# Patient Record
Sex: Female | Born: 1975
Health system: Southern US, Community
[De-identification: ages and names within clinical notes are randomized; demographics above are authoritative.]

## PROBLEM LIST (undated history)

## (undated) DIAGNOSIS — K219 Gastro-esophageal reflux disease without esophagitis: Secondary | ICD-10-CM

## (undated) DIAGNOSIS — L309 Dermatitis, unspecified: Secondary | ICD-10-CM

## (undated) DIAGNOSIS — J302 Other seasonal allergic rhinitis: Secondary | ICD-10-CM

## (undated) DIAGNOSIS — Z9289 Personal history of other medical treatment: Secondary | ICD-10-CM

## (undated) DIAGNOSIS — N939 Abnormal uterine and vaginal bleeding, unspecified: Secondary | ICD-10-CM

## (undated) HISTORY — DX: Gastro-esophageal reflux disease without esophagitis: K21.9

## (undated) HISTORY — PX: CERVICAL BIOPSY  W/ LOOP ELECTRODE EXCISION: SUR135

## (undated) HISTORY — PX: KNEE ARTHROSCOPY W/ ACL RECONSTRUCTION: SHX1858

---

## 1999-08-07 ENCOUNTER — Encounter: Payer: Self-pay | Admitting: Obstetrics and Gynecology

## 1999-08-07 ENCOUNTER — Ambulatory Visit (HOSPITAL_COMMUNITY): Admission: RE | Admit: 1999-08-07 | Discharge: 1999-08-07 | Payer: Self-pay | Admitting: Obstetrics and Gynecology

## 1999-09-04 ENCOUNTER — Ambulatory Visit (HOSPITAL_COMMUNITY): Admission: RE | Admit: 1999-09-04 | Discharge: 1999-09-04 | Payer: Self-pay | Admitting: Obstetrics & Gynecology

## 1999-09-04 ENCOUNTER — Encounter: Payer: Self-pay | Admitting: Obstetrics and Gynecology

## 2000-01-02 ENCOUNTER — Inpatient Hospital Stay (HOSPITAL_COMMUNITY): Admission: AD | Admit: 2000-01-02 | Discharge: 2000-01-02 | Payer: Self-pay | Admitting: Obstetrics and Gynecology

## 2000-01-03 ENCOUNTER — Inpatient Hospital Stay (HOSPITAL_COMMUNITY): Admission: AD | Admit: 2000-01-03 | Discharge: 2000-01-03 | Payer: Self-pay | Admitting: Obstetrics and Gynecology

## 2000-01-05 ENCOUNTER — Inpatient Hospital Stay (HOSPITAL_COMMUNITY): Admission: AD | Admit: 2000-01-05 | Discharge: 2000-01-05 | Payer: Self-pay | Admitting: Obstetrics and Gynecology

## 2000-01-08 ENCOUNTER — Inpatient Hospital Stay (HOSPITAL_COMMUNITY): Admission: AD | Admit: 2000-01-08 | Discharge: 2000-01-10 | Payer: Self-pay | Admitting: Obstetrics and Gynecology

## 2000-02-18 ENCOUNTER — Other Ambulatory Visit: Admission: RE | Admit: 2000-02-18 | Discharge: 2000-02-18 | Payer: Self-pay | Admitting: Obstetrics and Gynecology

## 2000-12-11 ENCOUNTER — Emergency Department (HOSPITAL_COMMUNITY): Admission: EM | Admit: 2000-12-11 | Discharge: 2000-12-11 | Payer: Self-pay | Admitting: Emergency Medicine

## 2001-02-04 ENCOUNTER — Other Ambulatory Visit: Admission: RE | Admit: 2001-02-04 | Discharge: 2001-02-04 | Payer: Self-pay | Admitting: Obstetrics and Gynecology

## 2002-04-06 ENCOUNTER — Other Ambulatory Visit: Admission: RE | Admit: 2002-04-06 | Discharge: 2002-04-06 | Payer: Self-pay | Admitting: Obstetrics and Gynecology

## 2003-01-05 ENCOUNTER — Other Ambulatory Visit: Admission: RE | Admit: 2003-01-05 | Discharge: 2003-01-05 | Payer: Self-pay | Admitting: Obstetrics and Gynecology

## 2003-01-26 ENCOUNTER — Ambulatory Visit (HOSPITAL_COMMUNITY): Admission: RE | Admit: 2003-01-26 | Discharge: 2003-01-26 | Payer: Self-pay | Admitting: Obstetrics and Gynecology

## 2003-01-26 HISTORY — PX: OTHER SURGICAL HISTORY: SHX169

## 2003-06-13 ENCOUNTER — Encounter: Payer: Self-pay | Admitting: Orthopedic Surgery

## 2003-06-14 ENCOUNTER — Ambulatory Visit (HOSPITAL_COMMUNITY): Admission: RE | Admit: 2003-06-14 | Discharge: 2003-06-14 | Payer: Self-pay | Admitting: Orthopedic Surgery

## 2003-06-14 HISTORY — PX: HIP ARTHROSCOPY W/ LABRAL DEBRIDEMENT: SHX1749

## 2003-06-29 ENCOUNTER — Other Ambulatory Visit: Admission: RE | Admit: 2003-06-29 | Discharge: 2003-06-29 | Payer: Self-pay | Admitting: Obstetrics and Gynecology

## 2004-07-26 ENCOUNTER — Other Ambulatory Visit: Admission: RE | Admit: 2004-07-26 | Discharge: 2004-07-26 | Payer: Self-pay | Admitting: Obstetrics and Gynecology

## 2005-06-25 ENCOUNTER — Encounter: Admission: RE | Admit: 2005-06-25 | Discharge: 2005-06-25 | Payer: Self-pay | Admitting: Neurology

## 2005-09-15 ENCOUNTER — Encounter: Admission: RE | Admit: 2005-09-15 | Discharge: 2005-09-15 | Payer: Self-pay | Admitting: Family Medicine

## 2005-09-22 ENCOUNTER — Encounter: Admission: RE | Admit: 2005-09-22 | Discharge: 2005-09-22 | Payer: Self-pay | Admitting: Family Medicine

## 2005-10-08 ENCOUNTER — Other Ambulatory Visit: Admission: RE | Admit: 2005-10-08 | Discharge: 2005-10-08 | Payer: Self-pay | Admitting: Obstetrics and Gynecology

## 2005-11-11 ENCOUNTER — Ambulatory Visit (HOSPITAL_COMMUNITY): Admission: RE | Admit: 2005-11-11 | Discharge: 2005-11-11 | Payer: Self-pay | Admitting: Obstetrics and Gynecology

## 2005-11-11 HISTORY — PX: LAPAROSCOPIC TUBAL LIGATION: SUR803

## 2006-01-12 ENCOUNTER — Encounter: Admission: RE | Admit: 2006-01-12 | Discharge: 2006-01-12 | Payer: Self-pay | Admitting: Family Medicine

## 2006-05-26 ENCOUNTER — Ambulatory Visit: Payer: Self-pay | Admitting: Internal Medicine

## 2006-06-02 ENCOUNTER — Ambulatory Visit: Payer: Self-pay | Admitting: Internal Medicine

## 2006-06-24 ENCOUNTER — Ambulatory Visit: Payer: Self-pay | Admitting: Family Medicine

## 2006-07-15 ENCOUNTER — Ambulatory Visit: Payer: Self-pay | Admitting: Family Medicine

## 2006-07-21 ENCOUNTER — Ambulatory Visit: Payer: Self-pay | Admitting: Family Medicine

## 2006-11-04 ENCOUNTER — Ambulatory Visit: Payer: Self-pay | Admitting: Internal Medicine

## 2006-12-02 ENCOUNTER — Ambulatory Visit: Payer: Self-pay | Admitting: Family Medicine

## 2007-01-04 ENCOUNTER — Ambulatory Visit: Payer: Self-pay | Admitting: Family Medicine

## 2007-01-11 DIAGNOSIS — J45909 Unspecified asthma, uncomplicated: Secondary | ICD-10-CM | POA: Insufficient documentation

## 2007-04-06 ENCOUNTER — Ambulatory Visit: Payer: Self-pay | Admitting: Family Medicine

## 2007-08-23 ENCOUNTER — Ambulatory Visit: Payer: Self-pay | Admitting: Family Medicine

## 2007-08-23 DIAGNOSIS — M25569 Pain in unspecified knee: Secondary | ICD-10-CM | POA: Insufficient documentation

## 2007-08-24 ENCOUNTER — Telehealth (INDEPENDENT_AMBULATORY_CARE_PROVIDER_SITE_OTHER): Payer: Self-pay | Admitting: *Deleted

## 2007-12-29 ENCOUNTER — Ambulatory Visit: Payer: Self-pay | Admitting: Internal Medicine

## 2007-12-29 DIAGNOSIS — N921 Excessive and frequent menstruation with irregular cycle: Secondary | ICD-10-CM | POA: Insufficient documentation

## 2008-01-02 LAB — CONVERTED CEMR LAB
ALT: 10 units/L (ref 0–35)
Albumin: 4.5 g/dL (ref 3.5–5.2)
BUN: 9 mg/dL (ref 6–23)
Bilirubin, Direct: 0.1 mg/dL (ref 0.0–0.3)
Eosinophils Relative: 2.6 % (ref 0.0–5.0)
GFR calc Af Amer: 107 mL/min
GFR calc non Af Amer: 88 mL/min
Hemoglobin: 14.5 g/dL (ref 12.0–15.0)
MCHC: 33.1 g/dL (ref 30.0–36.0)
Neutro Abs: 1.8 10*3/uL (ref 1.4–7.7)
Potassium: 4.3 meq/L (ref 3.5–5.1)
RBC: 4.4 M/uL (ref 3.87–5.11)
RDW: 11.6 % (ref 11.5–14.6)
Sodium: 139 meq/L (ref 135–145)
WBC: 5.5 10*3/uL (ref 4.5–10.5)

## 2008-01-03 ENCOUNTER — Encounter (INDEPENDENT_AMBULATORY_CARE_PROVIDER_SITE_OTHER): Payer: Self-pay | Admitting: *Deleted

## 2008-01-11 ENCOUNTER — Encounter (INDEPENDENT_AMBULATORY_CARE_PROVIDER_SITE_OTHER): Payer: Self-pay | Admitting: *Deleted

## 2008-01-11 ENCOUNTER — Ambulatory Visit: Payer: Self-pay | Admitting: Family Medicine

## 2008-01-11 LAB — CONVERTED CEMR LAB
OCCULT 2: NEGATIVE
OCCULT 3: NEGATIVE

## 2009-07-04 ENCOUNTER — Ambulatory Visit: Payer: Self-pay | Admitting: Internal Medicine

## 2009-07-04 ENCOUNTER — Encounter (INDEPENDENT_AMBULATORY_CARE_PROVIDER_SITE_OTHER): Payer: Self-pay | Admitting: *Deleted

## 2009-07-04 DIAGNOSIS — H6980 Other specified disorders of Eustachian tube, unspecified ear: Secondary | ICD-10-CM | POA: Insufficient documentation

## 2011-03-14 NOTE — Assessment & Plan Note (Signed)
[facility]                        [facility] OFFICE NOTE   NAME:[name], [name]                        MRN:          [id number]  DATE:12/02/2006                            DOB:          09-06-1976    REASON FOR VISIT:  Discuss smoking cessation.   [name] is a 35 year old female who reports that she is ready to stop  smoking. She has attempted several times in the past, but unfortunately  has not been successful. She has heard about the medication Chantix and  wanted to discuss starting the treatment with the above. She has no  other concerns.   PAST MEDICAL HISTORY:  Asthma.   PAST SURGICAL HISTORY:  1. Anterior cruciate ligament repair of the left knee in 1992.  2. LEEP procedure in 2003.  3. Right fourth digit hammertoe surgery in 2004.  4. Laparoscopy in 2004, IE, pelvic.  5. Hip surgery in 2004.  6. Miscarriage.  7. Tubal ligation.   CURRENT MEDICATIONS:  Albuterol p.r.n.   ALLERGIES:  SULFA.   OBJECTIVE:  Weight is 160, pulse is 68, blood pressure 108/78.  In general, pleasant female in no acute distress. Rest of the  examination was deferred.   IMPRESSION:  Smoking cessation counseling.   PLAN:  1. Advised the patient to let family and friends know of her quit      date. Also, recommended she call 1-800-QUITNOW.  2. Agreed to start on Chantix starter pack. Side effects and      instructions were provided.  3. The patient to followup with me in 1 month or sooner if needed.     Leanne Chang, M.D.  Electronically Signed    LA/MedQ  DD: 12/02/2006  DT: 12/02/2006  Job #: [id number]

## 2011-03-14 NOTE — Op Note (Signed)
[name], [name]                 ACCOUNT NO.:  [id number]   MEDICAL RECORD NO.:  [id number]          PATIENT TYPE:  AMB   LOCATION:  SDC                           FACILITY:  [facility]   PHYSICIAN:  Zenaida Niece, M.D.DATE OF BIRTH:  February 17, 1976   DATE OF PROCEDURE:  11/11/2005  DATE OF DISCHARGE:                                 OPERATIVE REPORT   PREOPERATIVE DIAGNOSIS:  Desires surgical sterility.   POSTOPERATIVE DIAGNOSIS:  Desires surgical sterility.   PROCEDURE:  Laparoscopy with bilateral tubal fulguration.   SURGEON:  Zenaida Niece, M.D.   ANESTHESIA:  General endotracheal tube.   SPECIMENS:  None.   ESTIMATED BLOOD LOSS:  Minimal.   COMPLICATIONS:  None.   FINDINGS:  Normal pelvic anatomy.   PROCEDURE IN DETAIL:  The patient was taken to the operating room and placed  in the dorsal supine position.  General anesthesia was induced and she was  placed in mobile stirrups.  Abdomen was prepped and draped in the usual  sterile fashion, bladder drained with a red rubber catheter, Hulka tenaculum  applied to the cervix for uterine manipulation.  Infraumbilical skin was  then infiltrated with 0.25% Marcaine and a 1.5 cm horizontal incision was  made in her previous scar.  The Veress needle was inserted into the  peritoneal cavity and placement confirmed by the water drop test and an  opening pressure of 3 mmHg.  CO2 gas was insufflated to a pressure of 14  mmHg and the Veress needle was removed.  The size 11 disposable trocar was  introduced and placement confirmed by the laparoscope.  Inspection revealed  normal pelvic anatomy.  Both fallopian tubes were identified and traced to  their fimbriated ends.  Three segments of the middle portion of each tube  were fulgurated with bipolar cautery under direct visualization until the  amp meter read 0 in all spots.  Again this was done on both sides with good  results.  Ovaries looked normal.  Again the remainder of the pelvis  remained  normal.  The laparoscope was then removed and all gas allowed to deflate  from the abdomen.  The umbilical trocar was removed.  Skin was closed with  interrupted subcuticular sutures of 4-0 Vicryl, followed by Dermabond.  The  Hulka tenaculum was then removed.  The patient tolerated the procedure well.  She was extubated in the operating room and taken to the recovery room in  stable condition.      Zenaida Niece, M.D.  Electronically Signed     TDM/MEDQ  D:  11/11/2005  T:  11/11/2005  Job:  [id number]
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
## 2011-03-14 NOTE — Assessment & Plan Note (Signed)
[facility]                          [facility] OFFICE NOTE   NAME:[name], [name]                        MRN:          [id number]  DATE:05/26/2006                            DOB:          09/02/1976    CHIEF COMPLAINT:  Ankle pain.   HISTORY OF PRESENT ILLNESS:  Ms. Nordgren is a 35 year old white female who is  a patient of Dr. Blossom Hoops, who came today due to right ankle pain.  The  pain started last night after she stood up from being seated in the [place]  position.  The pain is located at the right ankle, medial aspect.  She  denies any injury, swelling.  She has been taking some Motrin.   PAST MEDICAL HISTORY:  1.  History of mild asthma.  2.  In 1992, she had a left knee ACL repair.  3.  In 2003, had a LEEP procedure due to abnormal cervical cells.  4.  Status post surgery for fourth digit hammertoe on the right side.  Also,      on the fifth digit of the same foot in 2005.  5.  In 2004, she had a pelvic laparoscopy due to dyspareunia, according to      the patient.  6.  She had hip surgery in 2004, and apparently she was told she had some      loose cartilage.  7.  History of one miscarriage.  8.  History of tubal sterilization with Dr. Jackelyn Knife.   FAMILY HISTORY:  1.  Heart disease on the grandparents.  2.  Hypertension on the grandfather.  3.  Grandmother has diabetes.  4.  Great-grandparents had strokes and different cancers.  5.  Great uncle has muscular dystrophy.   REVIEW OF SYSTEMS:  Other than the pain in the ankle, she is doing well.   SOCIAL HISTORY:  She smokes cigarettes, about two-thirds of a pack a day.  Denies any alcohol drinking except maybe a glass of wine every six months.  She is married, and they have one child.   MEDICATIONS:  Not on a routine basis.   ALLERGIES:  She is allergic to SULFA.   PHYSICAL EXAMINATION:  VITAL SIGNS:  Blood pressure 120/80, pulse 86,  respirations 12.  GENERAL:  Patient is  alert and oriented in no apparent distress.  She weighs  156 pounds.  EXTREMITIES:  Lower extremities:  Left ankle is normal.  Left foot is  normal.  Right ankle is slightly tender at the medial aspect without any  swelling, redness, or warmth.  There is a slight discoloration around the  malleolus.  The range of motion is normal.  Pedal pulses also normal.   ASSESSMENT/PLAN:  Ms. Basham has most likely sprained the ankle.  At this  point, I recommend ice every night, elevate the leg as much as she can.  Use  Motrin p.r.n.  We are also going to give her an ankle support/splint to  replace the old one she has from a previous injury.  If that fails to help  or if she does not improve  in the next few days, she is to let us know.                                   Willow Ora, MD   JP/MedQ  DD:  05/26/2006  DT:  05/26/2006  Job #:  [id number]

## 2011-03-14 NOTE — Op Note (Signed)
NAME:  [name], [name]                           ACCOUNT NO.:  [id number]   MEDICAL RECORD NO.:  [id number]                   PATIENT TYPE:  AMB   LOCATION:  DAY                                  FACILITY:  [facility]   PHYSICIAN:  Zenaida Niece, M.D.             DATE OF BIRTH:  31-Aug-1976   DATE OF PROCEDURE:  01/26/2003  DATE OF DISCHARGE:                                 OPERATIVE REPORT   PREOPERATIVE DIAGNOSIS:  Dyspareunia.   POSTOPERATIVE DIAGNOSIS:  Dyspareunia.   PROCEDURE:  Diagnostic laparoscopy.   SURGEON:  Zenaida Niece, M.D.   ANESTHESIA:  General endotracheal tube.   ESTIMATED BLOOD LOSS:  Less than 50 mL.   FINDINGS:  Normal abdominal and pelvic anatomy with a simple 2 cm left  ovarian cyst.   PROCEDURE IN DETAIL:  The patient was taken to the operating room and placed  in the dorsal supine position.  General anesthesia was induced, and she was  placed in mobile stirrups.  Abdomen was prepped and draped in the usual  sterile fashion, bladder drained with a red rubber catheter, and a Hulka  tenaculum applied to her cervix for uterine manipulation.  Her  infraumbilical skin was then infiltrated with 0.25% Marcaine, and a 1.5 cm  horizontal incision was made.  I first attempted to enter the abdomen just  with the 10-11 disposable trocar.  However, I was unable to enter the  peritoneal cavity completely.  The Veress needle was then inserted into the  peritoneal cavity and the abdomen insufflated with CO2 gas to a pressure of  10 mmHg.  The Veress needle was removed, and the disposable trocar was  introduced without difficulty.  Placement was then confirmed by the  laparoscope.  A 5 mm port was placed low in the midline under direct  visualization.  Inspection revealed a normal appendix and normal bowels,  normal liver edge.  Uterus was normal and retroverted.  Both tubes and  ovaries were normal.  Anterior and posterior cul-de-sacs and both ovarian  fossae were  also normal.  There was no evidence of endometriosis or  adhesions.  No source for her dyspareunia was identified.  All sites were  hemostatic.  The 5 mm port was removed under direct visualization.  The  laparoscope was removed and all gas allowed to deflate from the abdomen.  The 10-11 disposable trocar was then removed.  The infraumbilical fascia was  reapproximated with a figure-of-eight suture of 0 Vicryl.  Skin incisions  were closed with interrupted subcuticular sutures of 4-0 Vicryl followed by  Steri-Strips and band-aids.  The Hulka tenaculum was then removed.  The  patient tolerated the procedure well, was extubated in the operating room,  and taken to the recovery room in stable condition.  Counts were correct,  and she was not given any antibiotics.  Zenaida Niece, M.D.    TDM/MEDQ  D:  01/26/2003  T:  01/26/2003  Job:  [id number]

## 2011-03-14 NOTE — Op Note (Signed)
NAME:  [name], [name]                           ACCOUNT NO.:  [id number]   MEDICAL RECORD NO.:  [id number]                   PATIENT TYPE:  OIB   LOCATION:  2899                                 FACILITY:  [facility]   PHYSICIAN:  Ollen Gross, M.D.                 DATE OF BIRTH:  05-25-1976   DATE OF PROCEDURE:  06/14/2003  DATE OF DISCHARGE:  06/14/2003                                 OPERATIVE REPORT   PREOPERATIVE DIAGNOSIS:  Left hip labral tear.   POSTOPERATIVE DIAGNOSIS:  Left hip labral tear plus chondral defect,  acetabulum.   OPERATION PERFORMED:  Left hip arthroscopy with labral debridement and  chondroplasty.   SURGEON:  Ollen Gross, M.D.   ASSISTANT:  Alexzandrew L. Julien Girt, P.A.   ANESTHESIA:  General.   ESTIMATED BLOOD LOSS:  Minimal.   DRAINS:  None.   COMPLICATIONS:  None.   CONDITION:  Stable to recovery.   INDICATIONS FOR PROCEDURE:  [name] is a 35 year old female with an  approximately two to three year history of activity related left hip and  groin pain which has gotten progressively worse over the past year or so.  Exam and history were suggestive of labral tear.  She has had intractable  pain and presents now for diagnostic arthroscopy with possible labral  debridement and chondroplasty.   DESCRIPTION OF PROCEDURE:  After successful administration of general  anesthetic, the patient was placed in the right lateral decubitus position  with the left side up and her left foot was well padded and placed in a  traction boot and the left leg was draped over the well-padded perineal  post.  Traction was applied across the hip under fluoroscopic guidance and  when adequately distracted, the leg was locked in that position and hip  prepped and draped in the usual sterile fashion.  The standard anterior and  posterior peritrochanteric portals were localized and spinal needles were  passed through the skin down into the joint.  They were confirmed to be  intra-articular by fluoroscopy and also by injecting saline through the  anterior needle and had outflow through the posterior needle.  Injected  about 30 mL saline through the joint, placed a nitinol wire through the  posterior needle, removed the needle and made incision around the wire.  We  dilated up to 5 mm and placed a 5 mm cannula posteriorly.  The camera was  then passed into the joint and arthroscopic visualization proceeded.   The chondral surface of the femoral head was completely normal.  Posterior,  superior and central acetabulum was normal.  Anteriorly, there was evidence  of anterior inferior, anterior central labral tear and there appeared to be  some chondral blistering adjacent to the tear.  We then passed a wire  through the anterior needle, made the incision, dilated it, passed a 5 mm  cannula.  I passed a shaver  into the joint and indeed, the blistering did  represent a chondral delamination of about 1 x 1 cm of the anterior inferior  and anterior central acetabulum.  We debrided the labral tear and chondral  defect back to a stable base using the 4.2 shaver as well as the ArthroCare  device. We used the ArthroCare on the labrum and the shaver on both the  labrum and the chondral defect.  The chondral defect was then probed and  found to be stable.  The overall area of this was about 1 x 1 cm at its  largest.  The remainder of the joint was inspected and looked normal.  There  was some anterior synovitis present and I used the ArthroCare to debride  that, so that would not serve as a potential source of catching in the  joint.  Once this was completed, then the arthroscopic equipment was removed  from the anterior portal and 20mL of 0.5% Marcaine with epinephrine injected  through the inflow cannula and the cannula was subsequently removed.  I  released the traction and confirmed that the hip was concentrically reduced  with a fluoroscopic view.  I then closed the  incisions with interrupted 4-0  nylon and placed a bulky sterile dressing.  She was taken out of the  traction boot, awakened and transported to recovery in stable condition.                                                Ollen Gross, M.D.    FA/MEDQ  D:  06/14/2003  T:  06/15/2003  Job:  [id number]

## 2015-10-28 HISTORY — PX: KNEE ARTHROSCOPY W/ MEDIAL COLLATERAL LIGAMENT (MCL) REPAIR: SHX1876

## 2016-05-27 DIAGNOSIS — D250 Submucous leiomyoma of uterus: Secondary | ICD-10-CM | POA: Insufficient documentation

## 2016-05-27 HISTORY — PX: OTHER SURGICAL HISTORY: SHX169

## 2016-09-05 ENCOUNTER — Ambulatory Visit (INDEPENDENT_AMBULATORY_CARE_PROVIDER_SITE_OTHER): Payer: BLUE CROSS/BLUE SHIELD | Admitting: Nurse Practitioner

## 2016-09-05 ENCOUNTER — Other Ambulatory Visit (INDEPENDENT_AMBULATORY_CARE_PROVIDER_SITE_OTHER): Payer: BLUE CROSS/BLUE SHIELD

## 2016-09-05 ENCOUNTER — Encounter: Payer: Self-pay | Admitting: Nurse Practitioner

## 2016-09-05 VITALS — BP 126/86 | HR 86 | Temp 98.4°F | Wt 201.0 lb

## 2016-09-05 DIAGNOSIS — R05 Cough: Secondary | ICD-10-CM

## 2016-09-05 DIAGNOSIS — R079 Chest pain, unspecified: Secondary | ICD-10-CM

## 2016-09-05 DIAGNOSIS — Z0001 Encounter for general adult medical examination with abnormal findings: Secondary | ICD-10-CM

## 2016-09-05 DIAGNOSIS — R059 Cough, unspecified: Secondary | ICD-10-CM

## 2016-09-05 DIAGNOSIS — R9431 Abnormal electrocardiogram [ECG] [EKG]: Secondary | ICD-10-CM

## 2016-09-05 DIAGNOSIS — K219 Gastro-esophageal reflux disease without esophagitis: Secondary | ICD-10-CM

## 2016-09-05 LAB — CBC WITH DIFFERENTIAL/PLATELET
BASOS ABS: 0 10*3/uL (ref 0.0–0.1)
Basophils Relative: 0.4 % (ref 0.0–3.0)
EOS PCT: 1 % (ref 0.0–5.0)
Eosinophils Absolute: 0.1 10*3/uL (ref 0.0–0.7)
HCT: 43 % (ref 36.0–46.0)
Hemoglobin: 14.4 g/dL (ref 12.0–15.0)
LYMPHS ABS: 2.4 10*3/uL (ref 0.7–4.0)
Lymphocytes Relative: 29.2 % (ref 12.0–46.0)
MCHC: 33.5 g/dL (ref 30.0–36.0)
MCV: 95.1 fl (ref 78.0–100.0)
MONO ABS: 0.5 10*3/uL (ref 0.1–1.0)
MONOS PCT: 6.3 % (ref 3.0–12.0)
NEUTROS ABS: 5.1 10*3/uL (ref 1.4–7.7)
NEUTROS PCT: 63.1 % (ref 43.0–77.0)
PLATELETS: 289 10*3/uL (ref 150.0–400.0)
RBC: 4.52 Mil/uL (ref 3.87–5.11)
RDW: 12.6 % (ref 11.5–15.5)
WBC: 8.2 10*3/uL (ref 4.0–10.5)

## 2016-09-05 LAB — COMPREHENSIVE METABOLIC PANEL
ALT: 11 U/L (ref 0–35)
AST: 12 U/L (ref 0–37)
Albumin: 4.5 g/dL (ref 3.5–5.2)
Alkaline Phosphatase: 83 U/L (ref 39–117)
BUN: 10 mg/dL (ref 6–23)
CO2: 29 meq/L (ref 19–32)
Calcium: 9.8 mg/dL (ref 8.4–10.5)
Chloride: 103 mEq/L (ref 96–112)
Creatinine, Ser: 0.84 mg/dL (ref 0.40–1.20)
GFR: 79.3 mL/min (ref >60.00)
GLUCOSE: 97 mg/dL (ref 70–99)
POTASSIUM: 3.8 meq/L (ref 3.5–5.1)
SODIUM: 140 meq/L (ref 135–145)
Total Bilirubin: 0.5 mg/dL (ref 0.2–1.2)
Total Protein: 7.7 g/dL (ref 6.0–8.3)

## 2016-09-05 LAB — LIPID PANEL
CHOL/HDL RATIO: 3
Cholesterol: 163 mg/dL (ref 0–200)
HDL: 48.1 mg/dL (ref >39.00)
LDL CALC: 100 mg/dL — AB (ref 0–99)
NONHDL: 115.23
Triglycerides: 76 mg/dL (ref 0.0–149.0)
VLDL: 15.2 mg/dL (ref 0.0–40.0)

## 2016-09-05 LAB — HEMOGLOBIN A1C: Hgb A1c MFr Bld: 5.2 % (ref 4.6–6.5)

## 2016-09-05 LAB — TSH: TSH: 0.88 u[IU]/mL (ref 0.35–4.50)

## 2016-09-05 MED ORDER — NITROGLYCERIN 0.4 MG SL SUBL: 0.4000 mg | SUBLINGUAL_TABLET | SUBLINGUAL | 0 refills | Status: DC | PRN

## 2016-09-05 MED ORDER — BENZONATATE 100 MG PO CAPS: 100.0000 mg | ORAL_CAPSULE | Freq: Three times a day (TID) | ORAL | 0 refills | Status: DC | PRN

## 2016-09-05 MED ORDER — OMEPRAZOLE 20 MG PO CPDR
20.0000 mg | DELAYED_RELEASE_CAPSULE | Freq: Every day | ORAL | 3 refills | Status: DC
Start: 2016-09-05 — End: 2016-12-09

## 2016-09-05 NOTE — Progress Notes (Signed)
Pre visit review using our clinic review tool, if applicable. No additional management support is needed unless otherwise documented below in the visit note. 

## 2016-09-05 NOTE — Progress Notes (Signed)
Normal results

## 2016-09-05 NOTE — Patient Instructions (Addendum)
Provided patient with printed educational material on nitroglycerine.  Angina Pectoris Angina pectoris is a very bad feeling in the chest, neck, or arm. Your doctor may call it angina. There are four types of angina. Angina is caused by a lack of blood in the middle and thickest layer of the heart wall (myocardium). Angina may feel like a crushing or squeezing pain in the chest. It may feel like tightness or heavy pressure in the chest. Some people say it feels like gas, heartburn, or indigestion. Some people have symptoms other than pain. These include:  Shortness of breath.  Cold sweats.  Feeling sick to your stomach (nausea).  Feeling light-headed. Many women have chest discomfort and some of the other symptoms. However, women often have different symptoms, such as:  Feeling tired (fatigue).  Feeling nervous for no reason.  Feeling weak for no reason.  Dizziness or fainting. Women may have angina without any symptoms. HOME CARE  Take medicines only as told by your doctor.  Take care of other health issues as told by your doctor. These include:  High blood pressure (hypertension).  Diabetes.  Follow a heart-healthy diet. Your doctor can help you to choose healthy food options and make changes.  Talk to your doctor to learn more about healthy cooking methods and use them. These include:  Roasting.  Grilling.  Broiling.  Baking.  Poaching.  Steaming.  Stir-frying.  Follow an exercise program approved by your doctor.  Keep a healthy weight. Lose weight as told by your doctor.  Rest when you are tired.  Learn to manage stress.  Do not use any tobacco, such as cigarettes, chewing tobacco, or electronic cigarettes. If you need help quitting, ask your doctor.  If you drink alcohol, and your doctor says it is okay, limit yourself to no more than 1 drink per day. One drink equals 12 ounces of beer, 5 ounces of wine, or 1 ounces of hard liquor.  Stop illegal  drug use.  Keep all follow-up visits as told by your doctor. This is important. Do not take these medicines unless your doctor says that you can:  Nonsteroidal anti-inflammatory drugs (NSAIDs). These include:  Ibuprofen.  Naproxen.  Celecoxib.  Vitamin supplements that have vitamin A, vitamin E, or both.  Hormone therapy that contains estrogen with or without progestin. GET HELP RIGHT AWAY IF:  You have pain in your chest, neck, arm, jaw, stomach, or back that:  Lasts more than a few minutes.  Comes back.  Does not get better after you take medicine under your tongue (sublingual nitroglycerin).  You have any of these symptoms for no reason:  Gas, heartburn, or indigestion.  Sweating a lot.  Shortness of breath or trouble breathing.  Feeling sick to your stomach or throwing up.  Feeling more tired than usual.  Feeling nervous or worrying more than usual.  Feeling weak.  Diarrhea.  You are suddenly dizzy or light-headed.  You faint or pass out. These symptoms may be an emergency. Do not wait to see if the symptoms will go away. Get medical help right away. Call your local emergency services (911 in the [place] Do not drive yourself to the hospital.   This information is not intended to replace advice given to you by your health care provider. Make sure you discuss any questions you have with your health care provider.   Document Released: 03/31/2008 Document Revised: 02/27/2015 Document Reviewed: 02/14/2014 Elsevier Interactive Patient Education [employer].

## 2016-09-05 NOTE — Progress Notes (Signed)
Subjective:    Patient ID: [name], female    DOB: Apr 10, 1976, 40 y.o.   MRN: [id number]  Patient presents today for complete physical or establish care (new patient) and cough and chest pain  Cough  This is a recurrent problem. The current episode started more than 1 month ago. The problem has been waxing and waning. The problem occurs constantly. The cough is non-productive. Associated symptoms include chest pain and heartburn. Pertinent negatives include no chills, ear congestion, ear pain, fever, headaches, hemoptysis, myalgias, nasal congestion, postnasal drip, rash, rhinorrhea, sore throat, shortness of breath, sweats, weight loss or wheezing. The symptoms are aggravated by lying down. She has tried OTC cough suppressant for the symptoms. The treatment provided no relief. Her past medical history is significant for environmental allergies. There is no history of asthma, bronchitis or pneumonia.  Chest Pain   This is a chronic problem. The current episode started more than 1 year ago. The onset quality is gradual. The problem occurs intermittently. The problem has been waxing and waning. The pain is present in the substernal region. The quality of the pain is described as dull and pressure. Associated symptoms include a cough. Pertinent negatives include no diaphoresis, dizziness, exertional chest pressure, fever, headaches, hemoptysis, nausea, numbness, orthopnea, palpitations, PND, shortness of breath, sputum production or weakness. It is unknown what precipitates the cough. The cough is non-productive. The cough is worsened by a supine position. The pain is aggravated by emotional upset. She has tried nothing for the symptoms. Risk factors include stress and obesity.  Pertinent negatives for past medical history include no anxiety/panic attacks, no arrhythmia, no CAD, no cancer and no recent injury.  Her family medical history is significant for CAD, heart disease, hyperlipidemia,  hypertension and stroke.  Pertinent negatives for family medical history include: no diabetes, no early MI, no PE and no PVD.    Immunizations: (TDAP, Hep C screen, Pneumovax, Influenza, zoster)  Health Maintenance  Topic Date Due  . HIV Screening  05/17/1990  . Tetanus Vaccine  05/17/1994  . Pap Smear  05/17/1996  . Flu Shot  01/24/2017*  *Topic was postponed. The date shown is not the original due date.   Diet:healthy Weight:  Wt Readings from Last 3 Encounters:  09/05/16 201 lb (91.2 kg)  07/04/09 163 lb (73.9 kg)  12/29/07 175 lb 4 oz (79.5 kg)   Exercise:none Fall Risk: Fall Risk  09/05/2016  Falls in the past year? No   Home Safety:home with husband Depression/Suicide: Depression screen [facility] 2/9 09/05/2016  Decreased Interest 0  Down, Depressed, Hopeless 0  PHQ - 2 Score 0   No flowsheet data found.  Pap Smear (every 10yrs for >21-29 without HPV, every 31yrs for >30-62yrs with HPV):up to date, done 04/2016,normal per patient, done by [facility], records requested from patient. Mammogram (yearly, >54yrs):up to date, normal per patient, last done 05/13/2016, records requested from patient.  Vision:up to date Dental:up to date Advanced Directive: Advanced Directives 09/05/2016  Does patient have an advance directive? No  Would patient like information on creating an advanced directive? No - patient declined information   Sexual History (birth control, marital status, STD):married, sexually active, s/p tubal ligation. LMP  Medications and allergies reviewed with patient and updated if appropriate.  Patient Active Problem List   Diagnosis Date Noted  . Cough 09/05/2016  . EUSTACHIAN TUBE DYSFUNCTION, RIGHT 07/04/2009  . IRREGULAR MENSES 12/29/2007  . KNEE PAIN 08/23/2007  . ASTHMA 01/11/2007  No current outpatient prescriptions on file prior to visit.   No current facility-administered medications on file prior to visit.     Past  Medical History:  Diagnosis Date  . Allergy   . GERD (gastroesophageal reflux disease)     Past Surgical History:  Procedure Laterality Date  . ABLATION     uterine due fibriods and polyp removed  . ARTHROSCOPIC REPAIR ACL    . hammertoe removed    . KNEE ARTHROSCOPY W/ ACL RECONSTRUCTION    . LAPAROSCOPY    . leap surgery    . TUBAL LIGATION    . WISDOM TOOTH EXTRACTION      Social History   Social History  . Marital status: Married    Spouse name: N/A  . Number of children: N/A  . Years of education: N/A   Social History Main Topics  . Smoking status: Former Research scientist (life sciences)  . Smokeless tobacco: Former Systems developer  . Alcohol use Yes  . Drug use: No  . Sexual activity: Not Asked   Other Topics Concern  . None   Social History Narrative  . None    Family History  Problem Relation Age of Onset  . Diabetes Maternal Grandmother   . Heart disease Maternal Grandfather   . Stroke Maternal Grandfather   . Cancer Maternal Grandfather   . Diabetes Maternal Grandfather   . Hypertension Maternal Grandfather   . Heart disease Paternal Grandmother   . Hypertension Paternal Grandmother   . Cancer Mother 73    urethra cancer with metastasis        Review of Systems  Constitutional: Negative for chills, diaphoresis, fever and weight loss.  HENT: Negative for ear pain, postnasal drip, rhinorrhea and sore throat.   Respiratory: Positive for cough. Negative for hemoptysis, sputum production, shortness of breath and wheezing.   Cardiovascular: Positive for chest pain. Negative for palpitations, orthopnea and PND.  Gastrointestinal: Positive for heartburn. Negative for nausea.  Musculoskeletal: Negative for myalgias.  Skin: Negative for rash.  Neurological: Negative for dizziness, weakness, numbness and headaches.  Endo/Heme/Allergies: Positive for environmental allergies.    Objective:   Vitals:   09/05/16 1429  BP: 126/86  Pulse: 86  Temp: 98.4 F (36.9 C)    There is no  height or weight on file to calculate BMI.   Physical Examination:  Physical Exam  Constitutional: She is oriented to person, place, and time and well-developed, well-nourished, and in no distress. No distress.  HENT:  Right Ear: Tympanic membrane, external ear and ear canal normal.  Left Ear: Hearing, tympanic membrane, external ear and ear canal normal.  Nose: Nose normal. No mucosal edema or rhinorrhea. Right sinus exhibits no maxillary sinus tenderness and no frontal sinus tenderness. Left sinus exhibits no maxillary sinus tenderness and no frontal sinus tenderness.  Mouth/Throat: Uvula is midline. No oral lesions. No trismus in the jaw. Posterior oropharyngeal erythema present. No oropharyngeal exudate.  Eyes: Conjunctivae and EOM are normal. Pupils are equal, round, and reactive to light. No scleral icterus.  Neck: Normal range of motion. Neck supple. No thyromegaly present.  Cardiovascular: Normal rate, normal heart sounds and intact distal pulses.   Pulmonary/Chest: Effort normal and breath sounds normal. No respiratory distress. She exhibits no tenderness.  Abdominal: Soft. Bowel sounds are normal. She exhibits no distension. There is no tenderness.  Musculoskeletal: Normal range of motion. She exhibits no edema or tenderness.  Lymphadenopathy:    She has no cervical adenopathy.  Neurological: She is alert  and oriented to person, place, and time. She has normal reflexes. No cranial nerve deficit. Gait normal. Coordination normal.  Skin: Skin is warm and dry.  Psychiatric: Affect and judgment normal.  Vitals reviewed.   ASSESSMENT and PLAN:  Selenne was seen today for annual exam.  Diagnoses and all orders for this visit:  Encounter for preventative adult health care exam with abnormal findings -     Comprehensive metabolic panel; Future -     Lipid panel; Future -     TSH; Future -     Hemoglobin A1c; Future  Chest pain, unspecified type -     EKG 12-Lead -     CBC with  Differential/Platelet; Future -     DG Chest 2 View; Future -     Ambulatory referral to Cardiology -     nitroGLYCERIN (NITROSTAT) 0.4 MG SL tablet; Place 1 tablet (0.4 mg total) under the tongue every 5 (five) minutes as needed for chest pain. Do not use more than 3 tabs within 24hrs.  Cough -     benzonatate (TESSALON) 100 MG capsule; Take 1 capsule (100 mg total) by mouth 3 (three) times daily as needed for cough. -     DG Chest 2 View; Future  Gastroesophageal reflux disease without esophagitis -     omeprazole (PRILOSEC) 20 MG capsule; Take 1 capsule (20 mg total) by mouth daily.  Nonspecific abnormal electrocardiogram (ECG) (EKG) -     Ambulatory referral to Cardiology -     nitroGLYCERIN (NITROSTAT) 0.4 MG SL tablet; Place 1 tablet (0.4 mg total) under the tongue every 5 (five) minutes as needed for chest pain. Do not use more than 3 tabs within 24hrs.   ECG: NSR with poor R-wave progression.  No problem-specific Assessment & Plan notes found for this encounter.    Recent Results (from the past 2160 hour(s))  CBC with Differential/Platelet     Status: None   Collection Time: 09/05/16  3:37 PM  Result Value Ref Range   WBC 8.2 4.0 - 10.5 K/uL   RBC 4.52 3.87 - 5.11 Mil/uL   Hemoglobin 14.4 12.0 - 15.0 g/dL   HCT 43.0 36.0 - 46.0 %   MCV 95.1 78.0 - 100.0 fl   MCHC 33.5 30.0 - 36.0 g/dL   RDW 12.6 11.5 - 15.5 %   Platelets 289.0 150.0 - 400.0 K/uL   Neutrophils Relative % 63.1 43.0 - 77.0 %   Lymphocytes Relative 29.2 12.0 - 46.0 %   Monocytes Relative 6.3 3.0 - 12.0 %   Eosinophils Relative 1.0 0.0 - 5.0 %   Basophils Relative 0.4 0.0 - 3.0 %   Neutro Abs 5.1 1.4 - 7.7 K/uL   Lymphs Abs 2.4 0.7 - 4.0 K/uL   Monocytes Absolute 0.5 0.1 - 1.0 K/uL   Eosinophils Absolute 0.1 0.0 - 0.7 K/uL   Basophils Absolute 0.0 0.0 - 0.1 K/uL  Comprehensive metabolic panel     Status: None   Collection Time: 09/05/16  3:37 PM  Result Value Ref Range   Sodium 140 135 - 145 mEq/L    Potassium 3.8 3.5 - 5.1 mEq/L   Chloride 103 96 - 112 mEq/L   CO2 29 19 - 32 mEq/L   Glucose, Bld 97 70 - 99 mg/dL   BUN 10 6 - 23 mg/dL   Creatinine, Ser 0.84 0.40 - 1.20 mg/dL   Total Bilirubin 0.5 0.2 - 1.2 mg/dL   Alkaline Phosphatase 83 39 - 117  U/L   AST 12 0 - 37 U/L   ALT 11 0 - 35 U/L   Total Protein 7.7 6.0 - 8.3 g/dL   Albumin 4.5 3.5 - 5.2 g/dL   Calcium 9.8 8.4 - 10.5 mg/dL   GFR 79.30 >60.00 mL/min  Lipid panel     Status: Abnormal   Collection Time: 09/05/16  3:37 PM  Result Value Ref Range   Cholesterol 163 0 - 200 mg/dL    Comment: ATP III Classification       Desirable:  < 200 mg/dL               Borderline High:  200 - 239 mg/dL          High:  > = 240 mg/dL   Triglycerides 76.0 0.0 - 149.0 mg/dL    Comment: Normal:  <150 mg/dLBorderline High:  150 - 199 mg/dL   HDL 48.10 >39.00 mg/dL   VLDL 15.2 0.0 - 40.0 mg/dL   LDL Cholesterol 100 (H) 0 - 99 mg/dL   Total CHOL/HDL Ratio 3     Comment:                Men          Women1/2 Average Risk     3.4          3.3Average Risk          5.0          4.42X Average Risk          9.6          7.13X Average Risk          15.0          11.0                       NonHDL 115.23     Comment: NOTE:  Non-HDL goal should be 30 mg/dL higher than patient's LDL goal (i.e. LDL goal of < 70 mg/dL, would have non-HDL goal of < 100 mg/dL)  TSH     Status: None   Collection Time: 09/05/16  3:37 PM  Result Value Ref Range   TSH 0.88 0.35 - 4.50 uIU/mL  Hemoglobin A1c     Status: None   Collection Time: 09/05/16  3:37 PM  Result Value Ref Range   Hgb A1c MFr Bld 5.2 4.6 - 6.5 %    Comment: Glycemic Control Guidelines for People with Diabetes:Non Diabetic:  <6%Goal of Therapy: <7%Additional Action Suggested:  >8%    Follow up: Return in about 3 months (around 12/06/2016), or if symptoms (cough) worsen or fail to improve in 2weeks, for chest pain and cough.  Wilfred Lacy, NP

## 2016-09-08 ENCOUNTER — Ambulatory Visit (INDEPENDENT_AMBULATORY_CARE_PROVIDER_SITE_OTHER)
Admission: RE | Admit: 2016-09-08 | Discharge: 2016-09-08 | Disposition: A | Discharge status: Home / Self Care | Payer: BLUE CROSS/BLUE SHIELD | Source: Ambulatory Visit | Attending: Nurse Practitioner | Admitting: Nurse Practitioner

## 2016-09-08 DIAGNOSIS — R05 Cough: Secondary | ICD-10-CM

## 2016-09-08 DIAGNOSIS — R079 Chest pain, unspecified: Secondary | ICD-10-CM

## 2016-09-08 DIAGNOSIS — R059 Cough, unspecified: Secondary | ICD-10-CM

## 2016-09-09 ENCOUNTER — Encounter: Payer: Self-pay | Admitting: Cardiology

## 2016-09-09 ENCOUNTER — Ambulatory Visit (INDEPENDENT_AMBULATORY_CARE_PROVIDER_SITE_OTHER): Payer: BLUE CROSS/BLUE SHIELD | Admitting: Cardiology

## 2016-09-09 VITALS — BP 110/84 | HR 77 | Ht 65.0 in | Wt 206.4 lb

## 2016-09-09 DIAGNOSIS — R05 Cough: Secondary | ICD-10-CM | POA: Diagnosis not present

## 2016-09-09 DIAGNOSIS — R079 Chest pain, unspecified: Secondary | ICD-10-CM | POA: Insufficient documentation

## 2016-09-09 DIAGNOSIS — M940 Chondrocostal junction syndrome [Tietze]: Secondary | ICD-10-CM | POA: Diagnosis not present

## 2016-09-09 DIAGNOSIS — R059 Cough, unspecified: Secondary | ICD-10-CM

## 2016-09-09 MED ORDER — IBUPROFEN 200 MG PO TABS: ORAL_TABLET | ORAL | 0 refills | Status: DC

## 2016-09-09 NOTE — Patient Instructions (Signed)
Take ADVIL/MOTRIN  - (GENERIC) 200 MG take 4 tablets three times a day for 2 days , take 3 tablets three for 2 days, take 2 tablets three times a day , take 1 tablet three times a day for 2 days   SCHEDULE AT [facility] has requested that you have an exercise tolerance test. For further information please visit [email or web address]. Please also follow instruction sheet, as given.    Your physician recommends that you schedule a follow-up appointment in: [place] - F/U TEST RESULTS

## 2016-09-09 NOTE — Progress Notes (Addendum)
PCP: Wilfred Lacy, NP  Clinic Note: Chief Complaint  Patient presents with  . New Patient (Initial Visit)    chest    HPI: LANIYAH STOCKSLAGER is a 40 y.o. female with a PMH below who presents today for cardiology evaluation of chest pain. This is associated with a cough.  Darrold Junker was seen by her PCP back on November 10 with complaint about one month of chest pain and heartburn associated with nonproductive cough. No other symptoms to suggest illness such as chills or fever headaches mild just nasal congestion etc. No rhinorrhea or dyspnea. No benefit from OTC cough suppressant. Symptoms are made worse by laying down.  Recent Hospitalizations: None  Studies Reviewed: None  Interval History: Mileya is a very pleasant young woman who notes that she's been having chest pain off and on now for several months. She then has also developed a cough over the last couple months but the pain came first. She describes the chest pain as a sharp pressure in the center of her chest just to the left side and it lasts for anywhere from her seconds to couple minutes. She often notes that her heart rate goes up the pain. There is no association with either rest or exertion. It is made worse with coughing. The severity of pain loss often take her breath away. It makes it very difficult for her to breathe.   She says these events occur maybe 3-4 times a week, often is more in the morning. No associated with particular food intake etc. She has noted a little bit of benefit from nitroglycerin.   In addition to her chest pain symptoms she is also noted a lot of GERD lately. This was made a lot worse by eating chocolate. This also correlates with when she was having worsening cough.   Review of Symptoms notable for increased heart rate with her chest pain episodes, but not irregular. Otherwise: No chest no chest pain associated with exertion. No resting or exertional dyspnea. No PND, orthopnea or edema.  No   lightheadedness, dizziness, weakness or syncope/near syncope. No TIA/amaurosis fugax symptoms. No claudication.  PAD Screen 09/09/2016  Previous PAD dx? No  Previous surgical procedure? No  Pain with walking? No  Feet/toe relief with dangling? No  Painful, non-healing ulcers? No  Extremities discolored? No    ROS: A comprehensive was performed. Review of Systems  Constitutional: Negative for chills, fever and malaise/fatigue.  HENT: Negative for congestion, hearing loss and nosebleeds.   Eyes: Positive for blurred vision.  Respiratory: Positive for cough (Worse over last few months). Negative for shortness of breath and wheezing.   Cardiovascular: Positive for chest pain and palpitations (Increased heart rate, not irregular).       Per history of present illness  Gastrointestinal: Positive for heartburn (Worse with chocolate. Improved some having cut back or Chocolate. Worse with lying down). Negative for abdominal pain, blood in stool and constipation.  Genitourinary: Negative for hematuria.  Musculoskeletal: Negative for back pain, joint pain and myalgias.  Neurological: Negative for dizziness, loss of consciousness and weakness.  Endo/Heme/Allergies: Negative for environmental allergies.  Psychiatric/Behavioral: Negative for depression and memory loss. The patient is not nervous/anxious and does not have insomnia.     Past Medical History:  Diagnosis Date  . Allergy   . GERD (gastroesophageal reflux disease)     Past Surgical History:  Procedure Laterality Date  . ABLATION     uterine due fibriods and polyp removed  .  ARTHROSCOPIC REPAIR ACL    . hammertoe removed    . KNEE ARTHROSCOPY W/ ACL RECONSTRUCTION    . LAPAROSCOPY    . leap surgery    . TUBAL LIGATION    . WISDOM TOOTH EXTRACTION      Current Meds  Medication Sig  . benzonatate (TESSALON) 100 MG capsule Take 1 capsule (100 mg total) by mouth 3 (three) times daily as needed for cough.  . fexofenadine  (ALLEGRA) 30 MG tablet Take 30 mg by mouth daily.  . nitroGLYCERIN (NITROSTAT) 0.4 MG SL tablet Place 1 tablet (0.4 mg total) under the tongue every 5 (five) minutes as needed for chest pain. Do not use more than 3 tabs within 24hrs.  Marland Kitchen omeprazole (PRILOSEC) 20 MG capsule Take 1 capsule (20 mg total) by mouth daily.   Allergies  Allergen Reactions  . Sulfonamide Derivatives     REACTION: unspecified     Social History   Social History  . Marital status: Married    Spouse name: N/A  . Number of children: 1  . Years of education: 64   Occupational History  . Customer service manager     [employer]   Social History Main Topics  . Smoking status: Former Smoker    Years: 10.00    Types: Cigarettes    Quit date: 2006  . Smokeless tobacco: Never Used  . Alcohol use 2.4 oz/week    4 Standard drinks or equivalent per week  . Drug use: No  . Sexual activity: Yes    Partners: Male   Other Topics Concern  . None   Social History Narrative  . None   Family History  Problem Relation Age of Onset  . Diabetes Maternal Grandmother   . Heart disease Maternal Grandfather   . Stroke Maternal Grandfather   . Cancer Maternal Grandfather   . Diabetes Maternal Grandfather   . Hypertension Maternal Grandfather   . Heart disease Paternal Grandmother   . Hypertension Paternal Grandmother   . Cancer Mother 59    urethra cancer with metastasis    Wt Readings from Last 3 Encounters:  09/09/16 93.6 kg (206 lb 6.4 oz)  09/05/16 91.2 kg (201 lb)  07/04/09 73.9 kg (163 lb)    PHYSICAL EXAM BP 110/84 (BP Location: Right Arm)   Pulse 77   Ht 5\' 5"  (1.651 m)   Wt 93.6 kg (206 lb 6.4 oz)   LMP 08/26/2016 (Exact Date) Comment: regular  BMI 34.35 kg/m  General appearance: alert, cooperative, appears stated age, no distress and Mild-moderately obese. Well nourished. Pleasant mood and affect. Neck: no adenopathy, no carotid bruit and no JVD Lungs: clear to auscultation  bilaterally, normal percussion bilaterally and non-labored Heart: regular rate and rhythm, S1& S2 normal, no murmur, click, rub or gallop ; nondisplaced PMI. She has point tenderness on the left costal margin that is worse at about the third and fourth rib. Abdomen: soft, non-tender; bowel sounds normal; no masses,  no organomegaly; no HJR Extremities: extremities normal, atraumatic, no cyanosis, or edema  Pulses: 2+ and symmetric;  Skin: mobility and turgor normal, no eczematous changes, no evidence of bleeding or bruising, no lesions noted and Several tattoos Neurologic: Mental status: Alert, oriented, thought content appropriate Cranial nerves: normal (II-XII grossly intact)    Adult ECG Report  Rate: 77 ;  Rhythm: normal sinus rhythm and Minimal criteria for LVH. Poor anterior R-wave progression cannot exclude anterior infarct, age undetermined.; otherwise normal axis intervals and  durations.  Narrative Interpretation: Borderline, no acute findings   Other studies Reviewed: Additional studies/ records that were reviewed today include:  Recent Labs:   Lab Results  Component Value Date   CHOL 163 09/05/2016   HDL 48.10 09/05/2016   LDLCALC 100 (H) 09/05/2016   TRIG 76.0 09/05/2016   CHOLHDL 3 09/05/2016     Chemistry      Component Value Date/Time   NA 140 09/05/2016 1537   K 3.8 09/05/2016 1537   CL 103 09/05/2016 1537   CO2 29 09/05/2016 1537   BUN 10 09/05/2016 1537   CREATININE 0.84 09/05/2016 1537      Component Value Date/Time   CALCIUM 9.8 09/05/2016 1537   ALKPHOS 83 09/05/2016 1537   AST 12 09/05/2016 1537   ALT 11 09/05/2016 1537   BILITOT 0.5 09/05/2016 1537       ASSESSMENT / PLAN: Problem List Items Addressed This Visit    Cough    Sounds like this may be related to GERD symptoms. Would recommend taking PPI medication versus H2 blocker. Also elevated foot of her bed to avoid nighttime GERD.      Chest pain with low risk for cardiac etiology - Primary      I do think her chest pain sounds more musculoskeletal in nature. It is reproducible on exam. It is not necessarily associated with any exertion, however with her being some relief with nitroglycerin, and some associated dyspnea, I would like to exclude ischemia.  Plan: [facility] - Graded Exercise Tolerance Test  Most likely musculoskeletal: NSAID taper Take ADVIL/MOTRIN  - (GENERIC) 200 MG take 4 tablets three times a day for 2 days , take 3 tablets three for 2 days, take 2 tablets three times a day , take 1 tablet three times a day for 2 days        Relevant Orders   EKG 12-Lead   EXERCISE TOLERANCE TEST   Costochondritis    Take ADVIL/MOTRIN  - (GENERIC) 200 MG take 4 tablets three times a day for 2 days , take 3 tablets three for 2 days, take 2 tablets three times a day , take 1 tablet three times a day for 2 days  If no relief, would then probably need to consider steroid taper.      Relevant Orders   EKG 12-Lead   EXERCISE TOLERANCE TEST      Current medicines are reviewed at length with the patient today. (+/- concerns) n/a The following changes have been made: see below   Patient Instructions  Take ADVIL/MOTRIN  - (GENERIC) 200 MG take 4 tablets three times a day for 2 days , take 3 tablets three for 2 days, take 2 tablets three times a day , take 1 tablet three times a day for 2 days   SCHEDULE AT [facility] has requested that you have an exercise tolerance test. For further information please visit [email or web address]. Please also follow instruction sheet, as given.    Your physician recommends that you schedule a follow-up appointment in: 4 WEEKS WITH DR HARDING - F/U TEST RESULTS      Studies Ordered:   Orders Placed This Encounter  Procedures  . EXERCISE TOLERANCE TEST  . EKG 12-Lead      Glenetta Hew, M.D., M.S. Interventional Cardiologist   Pager # [phone] Phone # [phone] [address] [place], [place] [address]

## 2016-09-09 NOTE — Assessment & Plan Note (Signed)
Take ADVIL/MOTRIN  - (GENERIC) 200 MG take 4 tablets three times a day for 2 days , take 3 tablets three for 2 days, take 2 tablets three times a day , take 1 tablet three times a day for 2 days  If no relief, would then probably need to consider steroid taper.

## 2016-09-09 NOTE — Assessment & Plan Note (Signed)
I do think her chest pain sounds more musculoskeletal in nature. It is reproducible on exam. It is not necessarily associated with any exertion, however with her being some relief with nitroglycerin, and some associated dyspnea, I would like to exclude ischemia.  Plan: [facility]XT - Graded Exercise Tolerance Test  Most likely musculoskeletal: NSAID taper Take ADVIL/MOTRIN  - (GENERIC) 200 MG take 4 tablets three times a day for 2 days , take 3 tablets three for 2 days, take 2 tablets three times a day , take 1 tablet three times a day for 2 days

## 2016-09-09 NOTE — Progress Notes (Signed)
Normal results

## 2016-09-09 NOTE — Assessment & Plan Note (Signed)
Sounds like this may be related to GERD symptoms. Would recommend taking PPI medication versus H2 blocker. Also elevated foot of her bed to avoid nighttime GERD.

## 2016-09-12 ENCOUNTER — Telehealth (HOSPITAL_COMMUNITY): Payer: Self-pay

## 2016-09-12 NOTE — Telephone Encounter (Signed)
Encounter complete. 

## 2016-09-17 ENCOUNTER — Ambulatory Visit (HOSPITAL_COMMUNITY)
Admission: RE | Admit: 2016-09-17 | Discharge: 2016-09-17 | Disposition: A | Discharge status: Home / Self Care | Payer: BLUE CROSS/BLUE SHIELD | Source: Ambulatory Visit | Attending: Internal Medicine | Admitting: Internal Medicine

## 2016-09-17 DIAGNOSIS — R079 Chest pain, unspecified: Secondary | ICD-10-CM

## 2016-09-17 DIAGNOSIS — M940 Chondrocostal junction syndrome [Tietze]: Secondary | ICD-10-CM | POA: Insufficient documentation

## 2016-09-17 LAB — EXERCISE TOLERANCE TEST
CHL CUP MPHR: 179 {beats}/min
CSEPHR: 98 %
CSEPPHR: 176 {beats}/min
Estimated workload: 10.1 METS
Exercise duration (min): 8 min
Exercise duration (sec): 21 s
RPE: 17
Rest HR: 90 {beats}/min

## 2016-09-23 ENCOUNTER — Encounter: Payer: Self-pay | Admitting: Internal Medicine

## 2016-09-23 ENCOUNTER — Ambulatory Visit (INDEPENDENT_AMBULATORY_CARE_PROVIDER_SITE_OTHER): Payer: BLUE CROSS/BLUE SHIELD | Admitting: Internal Medicine

## 2016-09-23 VITALS — BP 138/80 | HR 80 | Temp 98.1°F | Resp 20 | Wt 206.0 lb

## 2016-09-23 DIAGNOSIS — J4520 Mild intermittent asthma, uncomplicated: Secondary | ICD-10-CM | POA: Diagnosis not present

## 2016-09-23 DIAGNOSIS — J069 Acute upper respiratory infection, unspecified: Secondary | ICD-10-CM | POA: Diagnosis not present

## 2016-09-23 MED ORDER — LEVOFLOXACIN 500 MG PO TABS
500.0000 mg | ORAL_TABLET | Freq: Every day | ORAL | 0 refills | Status: AC
Start: 2016-09-23 — End: 2016-10-03

## 2016-09-23 MED ORDER — HYDROCODONE-HOMATROPINE 5-1.5 MG/5ML PO SYRP: 5.0000 mL | ORAL_SOLUTION | Freq: Four times a day (QID) | ORAL | 0 refills | Status: AC | PRN

## 2016-09-23 NOTE — Progress Notes (Signed)
Pre visit review using our clinic review tool, if applicable. No additional management support is needed unless otherwise documented below in the visit note. 

## 2016-09-23 NOTE — Patient Instructions (Addendum)
Please take all new medication as prescribed - the antibiotic, and cough medicine if needed  You can also take Mucinex (or it's generic off brand) for congestion, and tylenol as needed for pain.  Please continue all other medications as before, and refills have been done if requested.  Please have the pharmacy call with any other refills you may need.  Please keep your appointments with your specialists as you may have planned   

## 2016-09-29 DIAGNOSIS — J069 Acute upper respiratory infection, unspecified: Secondary | ICD-10-CM | POA: Insufficient documentation

## 2016-09-29 NOTE — Assessment & Plan Note (Signed)
Mild to mod, for antibx course,  to f/u any worsening symptoms or concerns 

## 2016-09-29 NOTE — Progress Notes (Signed)
   Subjective:    Patient ID: [name], female    DOB: December 05, 1975, 40 y.o.   MRN: [id number]  HPI  Here with 2-3 days acute onset fever, facial pain, pressure, headache, general weakness and malaise, and greenish d/c, with mild ST and cough, but pt denies chest pain, wheezing, increased sob or doe, orthopnea, PND, increased LE swelling, palpitations, dizziness or syncope.   Past Medical History:  Diagnosis Date  . Allergy   . GERD (gastroesophageal reflux disease)    Past Surgical History:  Procedure Laterality Date  . ABLATION     uterine due fibriods and polyp removed  . ARTHROSCOPIC REPAIR ACL    . hammertoe removed    . KNEE ARTHROSCOPY W/ ACL RECONSTRUCTION    . LAPAROSCOPY    . leap surgery    . TUBAL LIGATION    . WISDOM TOOTH EXTRACTION      reports that she quit smoking about 11 years ago. Her smoking use included Cigarettes. She quit after 10.00 years of use. She has never used smokeless tobacco. She reports that she drinks about 2.4 oz of alcohol per week . She reports that she does not use drugs. family history includes Cancer in her maternal grandfather; Cancer (age of onset: 7) in her mother; Diabetes in her maternal grandfather and maternal grandmother; Heart disease in her maternal grandfather and paternal grandmother; Hypertension in her maternal grandfather and paternal grandmother; Stroke in her maternal grandfather. Allergies  Allergen Reactions  . Sulfonamide Derivatives     REACTION: unspecified   Current Outpatient Prescriptions on File Prior to Visit  Medication Sig Dispense Refill  . benzonatate (TESSALON) 100 MG capsule Take 1 capsule (100 mg total) by mouth 3 (three) times daily as needed for cough. 20 capsule 0  . fexofenadine (ALLEGRA) 30 MG tablet Take 30 mg by mouth daily.    Marland Kitchen ibuprofen (ADVIL,MOTRIN) 200 MG tablet T 48 tablet 0  . nitroGLYCERIN (NITROSTAT) 0.4 MG SL tablet Place 1 tablet (0.4 mg total) under the tongue every 5 (five) minutes as  needed for chest pain. Do not use more than 3 tabs within 24hrs. 30 tablet 0  . omeprazole (PRILOSEC) 20 MG capsule Take 1 capsule (20 mg total) by mouth daily. 30 capsule 3   No current facility-administered medications on file prior to visit.    Review of Systems All otherwise neg per pt     Objective:   Physical Exam BP 138/80   Pulse 80   Temp 98.1 F (36.7 C) (Oral)   Resp 20   Wt 206 lb (93.4 kg)   LMP 08/26/2016 (Exact Date) Comment: regular  SpO2 98%   BMI 34.28 kg/m  VS noted, mild ill Constitutional: Pt appears in no apparent distress HENT: Head: NCAT.  Right Ear: External ear normal.  Left Ear: External ear normal.  Bilat tm's with mild erythema.  Max sinus areas mild tender.  Pharynx with mild erythema, no exudate Eyes: . Pupils are equal, round, and reactive to light. Conjunctivae and EOM are normal Neck: Normal range of motion. Neck supple.  Cardiovascular: Normal rate and regular rhythm.   Pulmonary/Chest: Effort normal and breath sounds without rales or wheezing.  Neurological: Pt is alert. Not confused , motor grossly intact Skin: Skin is warm. No rash, no LE edema Psychiatric: Pt behavior is normal. No agitation.     Assessment & Plan:

## 2016-09-29 NOTE — Assessment & Plan Note (Signed)
stable overall by history and exam, and pt to continue medical treatment as before,  to f/u any worsening symptoms or concerns 

## 2016-10-13 ENCOUNTER — Ambulatory Visit (INDEPENDENT_AMBULATORY_CARE_PROVIDER_SITE_OTHER): Payer: BLUE CROSS/BLUE SHIELD | Admitting: Cardiology

## 2016-10-13 ENCOUNTER — Encounter: Payer: Self-pay | Admitting: Cardiology

## 2016-10-13 ENCOUNTER — Telehealth: Payer: Self-pay | Admitting: Physician Assistant

## 2016-10-13 VITALS — BP 142/94 | HR 69 | Ht 65.5 in | Wt 201.0 lb

## 2016-10-13 DIAGNOSIS — M940 Chondrocostal junction syndrome [Tietze]: Secondary | ICD-10-CM

## 2016-10-13 DIAGNOSIS — R079 Chest pain, unspecified: Secondary | ICD-10-CM

## 2016-10-13 LAB — CK TOTAL AND CKMB (NOT AT ARMC): CK TOTAL: 35 U/L (ref 7–177)

## 2016-10-13 NOTE — Telephone Encounter (Signed)
Contacted by [facility] lab, per lab personnel, only CK-MB was ordered, however this also require measurement of total CK as well. Reviewed Dr. Allison Quarry note, seems like overall symptom is fairly atypical. CK-MB likely could be negative. Okay to add total CK. Will inform Dr. Ellyn Hack.  Hilbert Corrigan PA Pager: [phone]

## 2016-10-13 NOTE — Progress Notes (Signed)
PCP: Wilfred Lacy, NP  Clinic Note: Chief Complaint  Patient presents with  . Follow-up  . Chest Pain    pt states some this morning states it felt like a sharp pain it still feels like a achy pain   . Shortness of Breath    no SOB     HPI: [name] is a 40 y.o. female with a PMH below who presents today for cardiology evaluation of chest pain. This is associated with a cough.  [name] was Seen for initial consultation on November 14 with complaints of this chest discomfort:  chest pain off and on now for several months. She then has also developed a cough over the last couple months but the pain came first. She describes the chest pain as a sharp pressure in the center of her chest just to the left side and it lasts for anywhere from her seconds to couple minutes. She often notes that her heart rate goes up the pain. There is no association with either rest or exertion. It is made worse with coughing. The severity of pain loss often take her breath away. It makes it very difficult for her to breathe.  -- Y last saw her, we did a taper of this NSAIDs that seemed to help more than anything else. She started to do at this time around but wasn't quite sure.  Recent Hospitalizations: None - Saw her PCP back earlier this month for upper respiratory tract infection.  Studies Reviewed:   GXT 09/17/16: Negative GXT  The patient walked for 8:21 of a Bruce protocol. Peak HR of 179 which is 98% predicted maximal HR  There were no ST or T wave changes to suggest ischemia. BP response to exercise was normal  Interval History: [name] presents today for follow-up of her chest discomfort. She actually had a really prolonged spell this morning that lasted about a half an hour while driving to work. She describes it starting, both sides of the sternum and radiating out to both shoulders and up to her neck. This is the first episode she's had since December 6. Nothing really relieve the  symptoms at all. On occasion these episodes are relieved with nitroglycerin, but all the time. She is not had these episodes with exertion. She is doing her best to try to avoid coffee and chocolate skills they do increase or palpitations  She says these events used to happen about 3-4 times a week, but not quite as much.  Usually they occur in the morning. No associated with particular food intake etc. She has noted a little bit of benefit from nitroglycerin.    Review of Symptoms notable for increased heart rate with her chest pain episodes, but not irregular. Otherwise: She has not had chest pain associated with exertion. No resting or exertional dyspnea. No PND, orthopnea or edema.  No  lightheadedness, dizziness, weakness or syncope/near syncope. No TIA/amaurosis fugax symptoms. No claudication.  ROS: A comprehensive was performed. Review of Systems  Constitutional: Negative for chills, fever and malaise/fatigue.  HENT: Negative for congestion, hearing loss and nosebleeds.   Eyes: Positive for blurred vision.  Respiratory: Positive for cough (actually seems to be improving). Negative for shortness of breath and wheezing.   Cardiovascular: Positive for chest pain and palpitations (Increased heart rate, not irregular).       Per history of present illness  Gastrointestinal: Positive for heartburn (Worse with chocolate. Improved some having cut back or Chocolate. Worse with  lying down). Negative for abdominal pain, blood in stool and constipation.  Genitourinary: Negative for hematuria.  Musculoskeletal: Negative for back pain, joint pain and myalgias.  Neurological: Negative for dizziness, loss of consciousness and weakness.  Endo/Heme/Allergies: Negative for environmental allergies.  Psychiatric/Behavioral: Negative for depression and memory loss. The patient is not nervous/anxious and does not have insomnia.     Past Medical History:  Diagnosis Date  . Allergy   . GERD  (gastroesophageal reflux disease)     Past Surgical History:  Procedure Laterality Date  . ABLATION     uterine due fibriods and polyp removed  . ARTHROSCOPIC REPAIR ACL    . hammertoe removed    . KNEE ARTHROSCOPY W/ ACL RECONSTRUCTION    . LAPAROSCOPY    . leap surgery    . TUBAL LIGATION    . WISDOM TOOTH EXTRACTION      Current Meds  Medication Sig  . benzonatate (TESSALON) 100 MG capsule Take 1 capsule (100 mg total) by mouth 3 (three) times daily as needed for cough.  . fexofenadine (ALLEGRA) 30 MG tablet Take 30 mg by mouth daily.  Marland Kitchen ibuprofen (ADVIL,MOTRIN) 200 MG tablet T  . nitroGLYCERIN (NITROSTAT) 0.4 MG SL tablet Place 1 tablet (0.4 mg total) under the tongue every 5 (five) minutes as needed for chest pain. Do not use more than 3 tabs within 24hrs.  Marland Kitchen omeprazole (PRILOSEC) 20 MG capsule Take 1 capsule (20 mg total) by mouth daily.   Allergies  Allergen Reactions  . Sulfonamide Derivatives     REACTION: unspecified    Social History   Social History  . Marital status: Married    Spouse name: N/A  . Number of children: 1  . Years of education: 60   Occupational History  . Customer service manager     [employer]   Social History Main Topics  . Smoking status: Former Smoker    Years: 10.00    Types: Cigarettes    Quit date: 2006  . Smokeless tobacco: Never Used  . Alcohol use 2.4 oz/week    4 Standard drinks or equivalent per week  . Drug use: No  . Sexual activity: Yes    Partners: Male   Other Topics Concern  . None   Social History Narrative  . None   Family History  Problem Relation Age of Onset  . Diabetes Maternal Grandmother   . Heart disease Maternal Grandfather   . Stroke Maternal Grandfather   . Cancer Maternal Grandfather   . Diabetes Maternal Grandfather   . Hypertension Maternal Grandfather   . Heart disease Paternal Grandmother   . Hypertension Paternal Grandmother   . Cancer Mother 49    urethra cancer with  metastasis    Wt Readings from Last 3 Encounters:  10/13/16 91.2 kg (201 lb)  09/23/16 93.4 kg (206 lb)  09/09/16 93.6 kg (206 lb 6.4 oz)    PHYSICAL EXAM BP (!) 142/94   Pulse 69   Ht 5' 5.5" (1.664 m)   Wt 91.2 kg (201 lb)   BMI 32.94 kg/m  General appearance: alert, cooperative, appears stated age, no distress and Mild-moderately obese. Well nourished. Pleasant mood and affect. Neck: no adenopathy, no carotid bruit and no JVD Lungs: clear to auscultation bilaterally, normal percussion bilaterally and non-labored Heart: regular rate and rhythm, S1& S2 normal, no murmur, click, rub or gallop ; nondisplaced PMI.  Point tenderness on the left costal margin that is worse at about the  third and fourth rib. Abdomen: soft, non-tender; bowel sounds normal; no masses,  no organomegaly; no HJR Extremities: extremities normal, atraumatic, no cyanosis, or edema  Pulses: 2+ and symmetric;  Neurologic: Mental status: Alert, oriented, thought content appropriate   Adult ECG Report  Rate: 69 ;  Rhythm: normal sinus rhythm and Minimal criteria for LVH. Poor anterior R-wave progression cannot exclude anterior infarct, age undetermined.; otherwise normal axis intervals and durations.  Narrative Interpretation: Borderline, no acute findings - stable   Other studies Reviewed: Additional studies/ records that were reviewed today include:  Recent Labs:   Lab Results  Component Value Date   CHOL 163 09/05/2016   HDL 48.10 09/05/2016   LDLCALC 100 (H) 09/05/2016   TRIG 76.0 09/05/2016   CHOLHDL 3 09/05/2016     ASSESSMENT / PLAN: Problem List Items Addressed This Visit    Chest pain with low risk for cardiac etiology    Recurrent episodes of chest discomfort occurring at rest. This all started with an episode of upper respiratory tract infection. I still think she probably has some residual costochondritis. - We checked a CK-MB today which was essentially normal. This would argue against a  prolonged episode of chest pain being cardiac.  She did well on her GXT with no EKG changes or chest pain.  Plan: Redo Motrin taper. 800 mg 3 times a day for 2 days, 600 mg 3 times a day for 2 days, 400 mg 3 times a day for 2 days, 200 mg 3 times a day for 2 days.      Relevant Orders   EKG 12-Lead   Costochondritis - Primary    Symptoms were initially related with ibuprofen. Plan was to redo ibuprofen taper.  Next step would be to consider steroid taper      Relevant Orders   EKG 12-Lead      Current medicines are reviewed at length with the patient today. (+/- concerns) n/a The following changes have been made: see below   Patient Instructions  RESTART TAPER OF IBUPROFEN FOR CHEST DISCOMFORT   LABS TODAY - CKMB IF NEGATIVE   FOLLOW UP APPOINTMENT AS NEEDED   Studies Ordered:   Orders Placed This Encounter  Procedures  . CK total and CKMB (cardiac)not at [facility]  . EKG 12-Lead      Glenetta Hew, M.D., M.S. Interventional Cardiologist   Pager # [phone] Phone # [phone] [address] [place], [place] [address]

## 2016-10-13 NOTE — Patient Instructions (Addendum)
RESTART TAPER OF IBUPROFEN FOR CHEST DISCOMFORT   LABS TODAY - CKMB IF NEGATIVE   FOLLOW UP APPOINTMENT AS NEEDED

## 2016-10-14 ENCOUNTER — Telehealth: Payer: Self-pay | Admitting: *Deleted

## 2016-10-14 NOTE — Telephone Encounter (Signed)
spoke to patient . Notified patient lab value from yesterday  Were reviewed by dr harding ---ckmb was negative . Patient verbalized understanding.

## 2016-10-15 ENCOUNTER — Encounter: Payer: Self-pay | Admitting: Cardiology

## 2016-10-15 NOTE — Assessment & Plan Note (Addendum)
Recurrent episodes of chest discomfort occurring at rest. This all started with an episode of upper respiratory tract infection. I still think she probably has some residual costochondritis. - We checked a CK-MB today which was essentially normal. This would argue against a prolonged episode of chest pain being cardiac.  She did well on her GXT with no EKG changes or chest pain.  Plan: Redo Motrin taper. 800 mg 3 times a day for 2 days, 600 mg 3 times a day for 2 days, 400 mg 3 times a day for 2 days, 200 mg 3 times a day for 2 days.

## 2016-10-15 NOTE — Assessment & Plan Note (Signed)
Symptoms were initially related with ibuprofen. Plan was to redo ibuprofen taper.  Next step would be to consider steroid taper

## 2016-12-09 ENCOUNTER — Ambulatory Visit (INDEPENDENT_AMBULATORY_CARE_PROVIDER_SITE_OTHER): Payer: BLUE CROSS/BLUE SHIELD | Admitting: Nurse Practitioner

## 2016-12-09 ENCOUNTER — Encounter: Payer: Self-pay | Admitting: Nurse Practitioner

## 2016-12-09 VITALS — BP 128/82 | HR 90 | Temp 98.3°F | Ht 65.5 in | Wt 200.0 lb

## 2016-12-09 DIAGNOSIS — M940 Chondrocostal junction syndrome [Tietze]: Secondary | ICD-10-CM

## 2016-12-09 DIAGNOSIS — N921 Excessive and frequent menstruation with irregular cycle: Secondary | ICD-10-CM | POA: Diagnosis not present

## 2016-12-09 NOTE — Progress Notes (Signed)
Pre visit review using our clinic review tool, if applicable. No additional management support is needed unless otherwise documented below in the visit note. 

## 2016-12-09 NOTE — Progress Notes (Signed)
   Subjective:  Patient ID: [name], female    DOB: 07-22-1976  Age: 41 y.o. MRN: [id number]  CC: Follow-up (3 mo fu)   HPI   Chest Pain: Resolved. Normal treadmill stress test.  Menorrhagia: Menstrual cycle lasting for 12days. Using tampon and thick pad during cycle. Progesterone prescribed by GYN, she is also considering hysterectomy. Denies any dizziness or  fatigue.  Outpatient Medications Prior to Visit  Medication Sig Dispense Refill  . fexofenadine (ALLEGRA) 30 MG tablet Take 30 mg by mouth daily.    Marland Kitchen ibuprofen (ADVIL,MOTRIN) 200 MG tablet T 48 tablet 0  . nitroGLYCERIN (NITROSTAT) 0.4 MG SL tablet Place 1 tablet (0.4 mg total) under the tongue every 5 (five) minutes as needed for chest pain. Do not use more than 3 tabs within 24hrs. 30 tablet 0  . benzonatate (TESSALON) 100 MG capsule Take 1 capsule (100 mg total) by mouth 3 (three) times daily as needed for cough. (Patient not taking: Reported on 12/09/2016) 20 capsule 0  . omeprazole (PRILOSEC) 20 MG capsule Take 1 capsule (20 mg total) by mouth daily. (Patient not taking: Reported on 12/09/2016) 30 capsule 3   No facility-administered medications prior to visit.     ROS See HPI  Objective:  BP 128/82   Pulse 90   Temp 98.3 F (36.8 C)   Ht 5' 5.5" (1.664 m)   Wt 200 lb (90.7 kg)   SpO2 98%   BMI 32.78 kg/m   BP Readings from Last 3 Encounters:  12/09/16 128/82  10/13/16 (!) 142/94  09/23/16 138/80    Wt Readings from Last 3 Encounters:  12/09/16 200 lb (90.7 kg)  10/13/16 201 lb (91.2 kg)  09/23/16 206 lb (93.4 kg)    Physical Exam  Constitutional: She is oriented to person, place, and time. No distress.  HENT:  Mouth/Throat: Oropharynx is clear and moist.  Eyes: Conjunctivae are normal. No scleral icterus.  Neck: Normal range of motion. No thyromegaly present.  Cardiovascular: Normal rate, regular rhythm and normal heart sounds.   Pulmonary/Chest: Effort normal and breath sounds normal.    Musculoskeletal: She exhibits no edema.  Neurological: She is alert and oriented to person, place, and time.  Skin: Skin is warm and dry.  Vitals reviewed.   Lab Results  Component Value Date   WBC 8.2 09/05/2016   HGB 14.4 09/05/2016   HCT 43.0 09/05/2016   PLT 289.0 09/05/2016   GLUCOSE 97 09/05/2016   CHOL 163 09/05/2016   TRIG 76.0 09/05/2016   HDL 48.10 09/05/2016   LDLCALC 100 (H) 09/05/2016   ALT 11 09/05/2016   AST 12 09/05/2016   NA 140 09/05/2016   K 3.8 09/05/2016   CL 103 09/05/2016   CREATININE 0.84 09/05/2016   BUN 10 09/05/2016   CO2 29 09/05/2016   TSH 0.88 09/05/2016   HGBA1C 5.2 09/05/2016    No results found.  Assessment & Plan:   [name] was seen today for follow-up.  Diagnoses and all orders for this visit:  Costochondritis  Menorrhagia with irregular cycle   I have discontinued [name]'s omeprazole, benzonatate, and nitroGLYCERIN. I am also having her maintain her fexofenadine, ibuprofen, and norethindrone.  Meds ordered this encounter  Medications  . norethindrone (AYGESTIN) 5 MG tablet    Sig: Take 5 mg by mouth daily.    Follow-up: No Follow-up on file.  Wilfred Lacy, NP

## 2016-12-09 NOTE — Assessment & Plan Note (Addendum)
Resolved with ibuprofen use. Normal treadmill stress test.

## 2016-12-09 NOTE — Assessment & Plan Note (Signed)
Possible hysterectomy? Advised patient to use prenatal vitamin with iron. Encourage iron rich foods and adequate oral hydration.

## 2016-12-10 NOTE — Patient Instructions (Signed)
Iron-Rich Diet Introduction Iron is a mineral that helps your body to produce hemoglobin. Hemoglobin is a protein in your red blood cells that carries oxygen to your body's tissues. Eating too little iron may cause you to feel weak and tired, and it can increase your risk for infection. Eating enough iron is necessary for your body's metabolism, muscle function, and nervous system. Iron is naturally found in many foods. It can also be added to foods or fortified in foods. There are two types of dietary iron:  Heme iron. Heme iron is absorbed by the body more easily than nonheme iron. Heme iron is found in meat, poultry, and fish.  Nonheme iron. Nonheme iron is found in dietary supplements, iron-fortified grains, beans, and vegetables. You may need to follow an iron-rich diet if:  You have been diagnosed with iron deficiency or iron-deficiency anemia.  You have a condition that prevents you from absorbing dietary iron, such as:  Infection in your intestines.  Celiac disease. This involves long-lasting (chronic) inflammation of your intestines.  You do not eat enough iron.  You eat a diet that is high in foods that impair iron absorption.  You have lost a lot of blood.  You have heavy bleeding during your menstrual cycle.  You are pregnant. What is my plan? Your health care provider may help you to determine how much iron you need per day based on your condition. Generally, when a person consumes sufficient amounts of iron in the diet, the following iron needs are met:  Men.  35-41 years old: 11 mg per day.  73-46 years old: 8 mg per day.  Women.  5-74 years old: 15 mg per day.  102-56 years old: 18 mg per day.  Over 63 years old: 8 mg per day.  Pregnant women: 27 mg per day.  Breastfeeding women: 9 mg per day. What do I need to know about an iron-rich diet?  Eat fresh fruits and vegetables that are high in vitamin C along with foods that are high in iron. This will  help increase the amount of iron that your body absorbs from food, especially with foods containing nonheme iron. Foods that are high in vitamin C include oranges, peppers, tomatoes, and mango.  Take iron supplements only as directed by your health care provider. Overdose of iron can be life-threatening. If you were prescribed iron supplements, take them with orange juice or a vitamin C supplement.  Cook foods in pots and pans that are made from iron.  Eat nonheme iron-containing foods alongside foods that are high in heme iron. This helps to improve your iron absorption.  Certain foods and drinks contain compounds that impair iron absorption. Avoid eating these foods in the same meal as iron-rich foods or with iron supplements. These include:  Coffee, black tea, and red wine.  Milk, dairy products, and foods that are high in calcium.  Beans, soybeans, and peas.  Whole grains.  When eating foods that contain both nonheme iron and compounds that impair iron absorption, follow these tips to absorb iron better.  Soak beans overnight before cooking.  Soak whole grains overnight and drain them before using.  Ferment flours before baking, such as using yeast in bread dough. What foods can I eat? Grains  Iron-fortified breakfast cereal. Iron-fortified whole-wheat bread. Enriched rice. Sprouted grains. Vegetables  Spinach. Potatoes with skin. Green peas. Broccoli. Red and green bell peppers. Fermented vegetables. Fruits  Prunes. Raisins. Oranges. Strawberries. Mango. Grapefruit. Meats and Other Protein  Sources  Beef liver. Oysters. Beef. Shrimp. Turkey. Chicken. Tuna. Sardines. Chickpeas. Nuts. Tofu. Beverages  Tomato juice. Fresh orange juice. Prune juice. Hibiscus tea. Fortified instant breakfast shakes. Condiments  Tahini. Fermented soy sauce. Sweets and Desserts  Black-strap molasses. Other  Wheat germ. The items listed above may not be a complete list of recommended foods or  beverages. Contact your dietitian for more options.  What foods are not recommended? Grains  Whole grains. Bran cereal. Bran flour. Oats. Vegetables  Artichokes. Brussels sprouts. Kale. Fruits  Blueberries. Raspberries. Strawberries. Figs. Meats and Other Protein Sources  Soybeans. Products made from soy protein. Dairy  Milk. Cream. Cheese. Yogurt. Cottage cheese. Beverages  Coffee. Black tea. Red wine. Sweets and Desserts  Cocoa. Chocolate. Ice cream. Other  Basil. Oregano. Parsley. The items listed above may not be a complete list of foods and beverages to avoid. Contact your dietitian for more information.  This information is not intended to replace advice given to you by your health care provider. Make sure you discuss any questions you have with your health care provider. Document Released: 05/27/2005 Document Revised: 05/02/2016 Document Reviewed: 05/10/2014  2017 Elsevier  

## 2017-03-10 ENCOUNTER — Encounter (HOSPITAL_BASED_OUTPATIENT_CLINIC_OR_DEPARTMENT_OTHER): Payer: Self-pay | Admitting: *Deleted

## 2017-03-10 NOTE — Progress Notes (Signed)
NPO AFTER MN.  ARRIVE AT 0600.  NEEDS URINE PREG.  GETTING CBC DONE Monday 03-16-2017 THRU Wednesday 03-18-2017.  PT STATED WAS GIVEN INSTRUCTIONS FROM OFFICE TO TAKE 3 MEDICATIONS AM DOS , STATED FOLLOW THEIR INSTRUCTIONS AND CAN TAKE W/ SIPS OF WATER DOS.

## 2017-03-17 DIAGNOSIS — D259 Leiomyoma of uterus, unspecified: Secondary | ICD-10-CM | POA: Diagnosis not present

## 2017-03-17 DIAGNOSIS — N939 Abnormal uterine and vaginal bleeding, unspecified: Secondary | ICD-10-CM | POA: Diagnosis not present

## 2017-03-17 DIAGNOSIS — N879 Dysplasia of cervix uteri, unspecified: Secondary | ICD-10-CM | POA: Diagnosis not present

## 2017-03-17 DIAGNOSIS — E669 Obesity, unspecified: Secondary | ICD-10-CM | POA: Diagnosis not present

## 2017-03-17 DIAGNOSIS — Z87891 Personal history of nicotine dependence: Secondary | ICD-10-CM | POA: Diagnosis not present

## 2017-03-17 DIAGNOSIS — Z6834 Body mass index (BMI) 34.0-34.9, adult: Secondary | ICD-10-CM | POA: Diagnosis not present

## 2017-03-17 DIAGNOSIS — Z79899 Other long term (current) drug therapy: Secondary | ICD-10-CM | POA: Diagnosis not present

## 2017-03-17 DIAGNOSIS — N72 Inflammatory disease of cervix uteri: Secondary | ICD-10-CM | POA: Diagnosis not present

## 2017-03-17 LAB — CBC
HEMATOCRIT: 41.4 % (ref 36.0–46.0)
Hemoglobin: 13.9 g/dL (ref 12.0–15.0)
MCH: 32.7 pg (ref 26.0–34.0)
MCHC: 33.6 g/dL (ref 30.0–36.0)
MCV: 97.4 fL (ref 78.0–100.0)
Platelets: 259 10*3/uL (ref 150–400)
RBC: 4.25 MIL/uL (ref 3.87–5.11)
RDW: 12.5 % (ref 11.5–15.5)
WBC: 7.3 10*3/uL (ref 4.0–10.5)

## 2017-03-17 NOTE — H&P (Signed)
[name] is an 41 y.o. female. She had Novasure and hysteroscopic resection of submucosal myoma last August for menorrhagia and submucosal myoma.  She has continued to have abnormal bleeding which has not responded to Aygestin and desires definitive surgical therapy.  Pertinent Gynecological History: Last mammogram: normal Date: 03/2016 Last pap: normal Date: 07/2013 OB History: G2, P1011 SVD   Menstrual History: No LMP recorded (lmp unknown). Patient is not currently having periods (Reason: Irregular Periods).    Past Medical History:  Diagnosis Date  . Abnormal uterine bleeding (AUB)   . Eczema   . GERD (gastroesophageal reflux disease)   . History of exercise stress test    09-17-2016--- NORMAL  (NO ISCHEMIA AND NO EVIDENCE HEART ARTERY DISEASE)  . Seasonal allergies     Past Surgical History:  Procedure Laterality Date  . CERVICAL BIOPSY  W/ LOOP ELECTRODE EXCISION  yrs ago  . D & C HYSTEROSCOPY / POLYPECTOMY/  ENDOMETRIAL ABLATION  05/27/2016  . DX LAPAROSCOPY  01/26/2003  . HIP ARTHROSCOPY W/ LABRAL DEBRIDEMENT Left 06/14/2003   and Chondroplasty  . KNEE ARTHROSCOPY W/ ACL RECONSTRUCTION Bilateral left 1992/  right 2016  . KNEE ARTHROSCOPY W/ MEDIAL COLLATERAL LIGAMENT (MCL) REPAIR Right 2017  . LAPAROSCOPIC TUBAL LIGATION Bilateral 11/11/2005    Family History  Problem Relation Age of Onset  . Diabetes Maternal Grandmother   . Heart disease Maternal Grandfather   . Stroke Maternal Grandfather   . Cancer Maternal Grandfather   . Diabetes Maternal Grandfather   . Hypertension Maternal Grandfather   . Heart disease Paternal Grandmother   . Hypertension Paternal Grandmother   . Cancer Mother 63       urethra cancer with metastasis    Social History:  reports that she quit smoking about 12 years ago. Her smoking use included Cigarettes. She quit after 15.00 years of use. She has never used smokeless tobacco. She reports that she drinks about 2.4 oz of alcohol per  week . She reports that she does not use drugs.  Allergies:  Allergies  Allergen Reactions  . Sulfa Antibiotics Nausea Only    No prescriptions prior to admission.    Review of Systems  Respiratory: Negative.   Cardiovascular: Negative.   Gastrointestinal: Negative.   Genitourinary: Negative.     Height 5\' 6"  (1.676 m), weight 90.7 kg (200 lb). Physical Exam  Constitutional: She appears well-developed and well-nourished.  Neck: Neck supple. No thyromegaly present.  Cardiovascular: Normal rate, regular rhythm and normal heart sounds.   No murmur heard. Respiratory: Effort normal and breath sounds normal. No respiratory distress. She has no wheezes.  GI: Soft. She exhibits no distension and no mass. There is no tenderness.  Genitourinary: Vagina normal and uterus normal.  Genitourinary Comments: No adnexal mass    Results for orders placed or performed during the hospital encounter of 03/19/17 (from the past 24 hour(s))  CBC     Status: None   Collection Time: 03/17/17  8:00 AM  Result Value Ref Range   WBC 7.3 4.0 - 10.5 K/uL   RBC 4.25 3.87 - 5.11 MIL/uL   Hemoglobin 13.9 12.0 - 15.0 g/dL   HCT 41.4 36.0 - 46.0 %   MCV 97.4 78.0 - 100.0 fL   MCH 32.7 26.0 - 34.0 pg   MCHC 33.6 30.0 - 36.0 g/dL   RDW 12.5 11.5 - 15.5 %   Platelets 259 150 - 400 K/uL    No results found.  Assessment/Plan: Persistent  AUB s/p Novasure and resection of submucosal myoma, no improvement with Aygestin.  All medical and surgical options have been discussed, she wants to proceed with definitive surgical therapy.  Will admit for [facility] possible bilateral salpingectomy, possible cystoscopy.    Blane Ohara Shalawn Wynder 03/17/2017, 5:41 PM

## 2017-03-19 ENCOUNTER — Ambulatory Visit (HOSPITAL_BASED_OUTPATIENT_CLINIC_OR_DEPARTMENT_OTHER): Payer: BLUE CROSS/BLUE SHIELD | Admitting: Anesthesiology

## 2017-03-19 ENCOUNTER — Ambulatory Visit (HOSPITAL_BASED_OUTPATIENT_CLINIC_OR_DEPARTMENT_OTHER)
Admission: RE | Admit: 2017-03-19 | Discharge: 2017-03-19 | Disposition: A | Discharge status: Home / Self Care | Payer: BLUE CROSS/BLUE SHIELD | Source: Ambulatory Visit | Attending: Obstetrics and Gynecology | Admitting: Obstetrics and Gynecology

## 2017-03-19 ENCOUNTER — Encounter (HOSPITAL_BASED_OUTPATIENT_CLINIC_OR_DEPARTMENT_OTHER): Payer: Self-pay

## 2017-03-19 ENCOUNTER — Encounter (HOSPITAL_COMMUNITY)
Admission: RE | Disposition: A | Discharge status: Home / Self Care | Payer: Self-pay | Source: Ambulatory Visit | Attending: Obstetrics and Gynecology

## 2017-03-19 DIAGNOSIS — N879 Dysplasia of cervix uteri, unspecified: Secondary | ICD-10-CM | POA: Insufficient documentation

## 2017-03-19 DIAGNOSIS — N72 Inflammatory disease of cervix uteri: Secondary | ICD-10-CM | POA: Insufficient documentation

## 2017-03-19 DIAGNOSIS — Z6834 Body mass index (BMI) 34.0-34.9, adult: Secondary | ICD-10-CM | POA: Insufficient documentation

## 2017-03-19 DIAGNOSIS — Z87891 Personal history of nicotine dependence: Secondary | ICD-10-CM | POA: Insufficient documentation

## 2017-03-19 DIAGNOSIS — N939 Abnormal uterine and vaginal bleeding, unspecified: Secondary | ICD-10-CM | POA: Diagnosis not present

## 2017-03-19 DIAGNOSIS — Z79899 Other long term (current) drug therapy: Secondary | ICD-10-CM | POA: Insufficient documentation

## 2017-03-19 DIAGNOSIS — E669 Obesity, unspecified: Secondary | ICD-10-CM | POA: Insufficient documentation

## 2017-03-19 DIAGNOSIS — D259 Leiomyoma of uterus, unspecified: Secondary | ICD-10-CM | POA: Insufficient documentation

## 2017-03-19 DIAGNOSIS — Z90710 Acquired absence of both cervix and uterus: Secondary | ICD-10-CM | POA: Diagnosis present

## 2017-03-19 HISTORY — PX: CYSTOSCOPY: SHX5120

## 2017-03-19 HISTORY — DX: Abnormal uterine and vaginal bleeding, unspecified: N93.9

## 2017-03-19 HISTORY — DX: Other seasonal allergic rhinitis: J30.2

## 2017-03-19 HISTORY — PX: BILATERAL SALPINGECTOMY: SHX5743

## 2017-03-19 HISTORY — PX: VAGINAL HYSTERECTOMY: SHX2639

## 2017-03-19 HISTORY — DX: Dermatitis, unspecified: L30.9

## 2017-03-19 HISTORY — DX: Personal history of other medical treatment: Z92.89

## 2017-03-19 LAB — POCT PREGNANCY, URINE: Preg Test, Ur: NEGATIVE

## 2017-03-19 SURGERY — HYSTERECTOMY, VAGINAL
Anesthesia: General | Site: Uterus

## 2017-03-19 MED ORDER — LIDOCAINE 2% (20 MG/ML) 5 ML SYRINGE
INTRAMUSCULAR | Status: DC | PRN
  Administered 2017-03-19: 100 mg via INTRAVENOUS

## 2017-03-19 MED ORDER — LIDOCAINE 2% (20 MG/ML) 5 ML SYRINGE
INTRAMUSCULAR | Status: AC
  Filled 2017-03-19: qty 5

## 2017-03-19 MED ORDER — KETOROLAC TROMETHAMINE 30 MG/ML IJ SOLN: 30.0000 mg | Freq: Four times a day (QID) | INTRAMUSCULAR | Status: DC

## 2017-03-19 MED ORDER — CEFAZOLIN SODIUM-DEXTROSE 2-4 GM/100ML-% IV SOLN
INTRAVENOUS | Status: AC
  Filled 2017-03-19: qty 100

## 2017-03-19 MED ORDER — FENTANYL CITRATE (PF) 250 MCG/5ML IJ SOLN
INTRAMUSCULAR | Status: AC
  Filled 2017-03-19: qty 5

## 2017-03-19 MED ORDER — ONDANSETRON HCL 4 MG/2ML IJ SOLN
INTRAMUSCULAR | Status: DC | PRN
  Administered 2017-03-19: 4 mg via INTRAVENOUS

## 2017-03-19 MED ORDER — LACTATED RINGERS IV SOLN
INTRAVENOUS | Status: DC
Start: 2017-03-19 — End: 2017-03-19
  Administered 2017-03-19 (×2): via INTRAVENOUS
  Filled 2017-03-19: qty 1000

## 2017-03-19 MED ORDER — SODIUM CHLORIDE 0.9 % IJ SOLN
INTRAMUSCULAR | Status: DC | PRN
  Administered 2017-03-19: 50 mL via INTRAVENOUS

## 2017-03-19 MED ORDER — PROPOFOL 10 MG/ML IV BOLUS
INTRAVENOUS | Status: AC
  Filled 2017-03-19: qty 40

## 2017-03-19 MED ORDER — KETOROLAC TROMETHAMINE 30 MG/ML IJ SOLN: 30.0000 mg | Freq: Once | INTRAMUSCULAR | Status: DC

## 2017-03-19 MED ORDER — CEFAZOLIN SODIUM-DEXTROSE 2-4 GM/100ML-% IV SOLN
2.0000 g | INTRAVENOUS | Status: AC
  Administered 2017-03-19: 2 g via INTRAVENOUS
  Filled 2017-03-19: qty 100

## 2017-03-19 MED ORDER — ACETAMINOPHEN 160 MG/5ML PO SOLN
975.0000 mg | Freq: Once | ORAL | Status: AC
  Administered 2017-03-19: 975 mg via ORAL
  Filled 2017-03-19: qty 40.6

## 2017-03-19 MED ORDER — PROMETHAZINE HCL 25 MG/ML IJ SOLN
6.2500 mg | INTRAMUSCULAR | Status: DC | PRN
  Filled 2017-03-19: qty 1

## 2017-03-19 MED ORDER — OXYCODONE HCL 5 MG/5ML PO SOLN
5.0000 mg | Freq: Once | ORAL | Status: DC | PRN
  Filled 2017-03-19: qty 5

## 2017-03-19 MED ORDER — GABAPENTIN 300 MG PO CAPS: 300.0000 mg | ORAL_CAPSULE | Freq: Three times a day (TID) | ORAL | Status: DC

## 2017-03-19 MED ORDER — OXYCODONE-ACETAMINOPHEN 5-325 MG PO TABS: 1.0000 | ORAL_TABLET | ORAL | Status: DC | PRN

## 2017-03-19 MED ORDER — KETOROLAC TROMETHAMINE 30 MG/ML IJ SOLN
INTRAMUSCULAR | Status: AC
  Filled 2017-03-19: qty 1

## 2017-03-19 MED ORDER — ONDANSETRON HCL 4 MG/2ML IJ SOLN
INTRAMUSCULAR | Status: AC
  Filled 2017-03-19: qty 2

## 2017-03-19 MED ORDER — ACETAMINOPHEN 160 MG/5ML PO SOLN
ORAL | Status: AC
  Filled 2017-03-19: qty 40.6

## 2017-03-19 MED ORDER — BUPIVACAINE HCL (PF) 0.5 % IJ SOLN
INTRAMUSCULAR | Status: AC
  Filled 2017-03-19: qty 30

## 2017-03-19 MED ORDER — MIDAZOLAM HCL 5 MG/5ML IJ SOLN
INTRAMUSCULAR | Status: DC | PRN
  Administered 2017-03-19: 2 mg via INTRAVENOUS

## 2017-03-19 MED ORDER — KETOROLAC TROMETHAMINE 30 MG/ML IJ SOLN
INTRAMUSCULAR | Status: DC | PRN
  Administered 2017-03-19: 30 mg via INTRAVENOUS

## 2017-03-19 MED ORDER — BUPIVACAINE HCL (PF) 0.5 % IJ SOLN
INTRAMUSCULAR | Status: DC | PRN
  Administered 2017-03-19: 30 mL

## 2017-03-19 MED ORDER — FENTANYL CITRATE (PF) 100 MCG/2ML IJ SOLN
INTRAMUSCULAR | Status: DC | PRN
  Administered 2017-03-19 (×3): 50 ug via INTRAVENOUS

## 2017-03-19 MED ORDER — OXYCODONE HCL 5 MG PO TABS
5.0000 mg | ORAL_TABLET | Freq: Once | ORAL | Status: DC | PRN
  Filled 2017-03-19: qty 1

## 2017-03-19 MED ORDER — MIDAZOLAM HCL 2 MG/2ML IJ SOLN
INTRAMUSCULAR | Status: AC
  Filled 2017-03-19: qty 2

## 2017-03-19 MED ORDER — ONDANSETRON HCL 4 MG PO TABS: 4.0000 mg | ORAL_TABLET | Freq: Four times a day (QID) | ORAL | Status: DC | PRN

## 2017-03-19 MED ORDER — DEXAMETHASONE SODIUM PHOSPHATE 10 MG/ML IJ SOLN
INTRAMUSCULAR | Status: DC | PRN
  Administered 2017-03-19: 10 mg via INTRAVENOUS

## 2017-03-19 MED ORDER — DEXAMETHASONE SODIUM PHOSPHATE 10 MG/ML IJ SOLN
INTRAMUSCULAR | Status: AC
  Filled 2017-03-19: qty 1

## 2017-03-19 MED ORDER — ALUM & MAG HYDROXIDE-SIMETH 200-200-20 MG/5ML PO SUSP: 30.0000 mL | ORAL | Status: DC | PRN

## 2017-03-19 MED ORDER — SODIUM CHLORIDE 0.9 % IR SOLN
Status: DC | PRN
  Administered 2017-03-19: 1000 mL

## 2017-03-19 MED ORDER — HYDROMORPHONE HCL 1 MG/ML IJ SOLN
0.2500 mg | INTRAMUSCULAR | Status: DC | PRN
  Filled 2017-03-19: qty 0.5

## 2017-03-19 MED ORDER — IBUPROFEN 200 MG PO TABS: 600.0000 mg | ORAL_TABLET | Freq: Four times a day (QID) | ORAL | Status: DC | PRN

## 2017-03-19 MED ORDER — SODIUM CHLORIDE 0.9 % IJ SOLN
INTRAMUSCULAR | Status: AC
  Filled 2017-03-19: qty 50

## 2017-03-19 MED ORDER — MENTHOL 3 MG MT LOZG: 1.0000 | LOZENGE | OROMUCOSAL | Status: DC | PRN

## 2017-03-19 MED ORDER — SIMETHICONE 80 MG PO CHEW: 80.0000 mg | CHEWABLE_TABLET | Freq: Four times a day (QID) | ORAL | Status: DC | PRN

## 2017-03-19 MED ORDER — ACETAMINOPHEN 500 MG PO TABS
ORAL_TABLET | ORAL | Status: AC
  Filled 2017-03-19: qty 2

## 2017-03-19 MED ORDER — VASOPRESSIN 20 UNIT/ML IV SOLN
INTRAVENOUS | Status: DC | PRN
  Administered 2017-03-19: 2 mL via INTRAMUSCULAR

## 2017-03-19 MED ORDER — VASOPRESSIN 20 UNIT/ML IV SOLN
INTRAVENOUS | Status: AC
  Filled 2017-03-19: qty 1

## 2017-03-19 MED ORDER — DEXTROSE-NACL 5-0.45 % IV SOLN: INTRAVENOUS | Status: DC

## 2017-03-19 MED ORDER — HYDROMORPHONE HCL 1 MG/ML IJ SOLN: 1.0000 mg | INTRAMUSCULAR | Status: DC | PRN

## 2017-03-19 MED ORDER — PROPOFOL 10 MG/ML IV BOLUS
INTRAVENOUS | Status: DC | PRN
  Administered 2017-03-19: 250 mg via INTRAVENOUS
  Administered 2017-03-19: 100 mg via INTRAVENOUS

## 2017-03-19 MED ORDER — LIDOCAINE HCL 2 % IJ SOLN
INTRAMUSCULAR | Status: AC
  Filled 2017-03-19: qty 20

## 2017-03-19 MED ORDER — ONDANSETRON HCL 4 MG/2ML IJ SOLN
4.0000 mg | Freq: Four times a day (QID) | INTRAMUSCULAR | Status: DC | PRN
Start: 2017-03-19 — End: 2017-03-19

## 2017-03-19 SURGICAL SUPPLY — 49 items
CANISTER SUCT 3000ML PPV (MISCELLANEOUS) ×4 IMPLANT
CATH ROBINSON RED A/P 16FR (CATHETERS) ×7 IMPLANT
CLOTH BEACON ORANGE TIMEOUT ST (SAFETY) ×4 IMPLANT
COVER BACK TABLE 60X90IN (DRAPES) ×4 IMPLANT
DECANTER SPIKE VIAL GLASS SM (MISCELLANEOUS) IMPLANT
DRAPE LG THREE QUARTER DISP (DRAPES) ×4 IMPLANT
DRSG TELFA 3X8 NADH (GAUZE/BANDAGES/DRESSINGS) ×4 IMPLANT
ELECT REM PT RETURN 9FT ADLT (ELECTROSURGICAL) ×4
ELECTRODE REM PT RTRN 9FT ADLT (ELECTROSURGICAL) ×3 IMPLANT
GLOVE BIO SURGEON STRL SZ8 (GLOVE) ×4 IMPLANT
GLOVE BIOGEL PI IND STRL 6.5 (GLOVE) ×3 IMPLANT
GLOVE BIOGEL PI IND STRL 7.0 (GLOVE) ×3 IMPLANT
GLOVE BIOGEL PI INDICATOR 6.5 (GLOVE) ×1
GLOVE BIOGEL PI INDICATOR 7.0 (GLOVE) ×1
GLOVE ORTHO TXT STRL SZ7.5 (GLOVE) ×4 IMPLANT
GOWN STRL REUS W/TWL LRG LVL3 (GOWN DISPOSABLE) ×12 IMPLANT
GOWN STRL REUS W/TWL XL LVL3 (GOWN DISPOSABLE) ×4 IMPLANT
GOWN W/COTTON TOWEL STD LRG (GOWNS) ×5 IMPLANT
GOWN XL W/COTTON TOWEL STD (GOWNS) ×5 IMPLANT
KIT RM TURNOVER CYSTO AR (KITS) ×4 IMPLANT
LEGGING LITHOTOMY PAIR STRL (DRAPES) ×4 IMPLANT
MANIFOLD NEPTUNE II (INSTRUMENTS) IMPLANT
NDL SPNL 22GX3.5 QUINCKE BK (NEEDLE) ×3 IMPLANT
NEEDLE SPNL 22GX3.5 QUINCKE BK (NEEDLE) ×4 IMPLANT
NS IRRIG 500ML POUR BTL (IV SOLUTION) ×4 IMPLANT
PACK BASIN DAY SURGERY FS (CUSTOM PROCEDURE TRAY) ×4 IMPLANT
PACK TRENDGUARD 600 HYBRD PROC (MISCELLANEOUS) IMPLANT
PACK VAGINAL WOMENS (CUSTOM PROCEDURE TRAY) ×4 IMPLANT
PAD DRESSING TELFA 3X8 NADH (GAUZE/BANDAGES/DRESSINGS) ×3 IMPLANT
PAD OB MATERNITY 4.3X12.25 (PERSONAL CARE ITEMS) ×4 IMPLANT
PAD PREP 24X48 CUFFED NSTRL (MISCELLANEOUS) ×4 IMPLANT
SET IRRIG Y TYPE TUR BLADDER L (SET/KITS/TRAYS/PACK) IMPLANT
SHEARS FOC LG CVD HARMONIC 17C (MISCELLANEOUS) ×5 IMPLANT
SUT CHROMIC 1 TIES 18 (SUTURE) ×4 IMPLANT
SUT CHROMIC 1MO 4 18 CR8 (SUTURE) ×4 IMPLANT
SUT SILK 2 0 SH (SUTURE) ×4 IMPLANT
SUT VIC AB 2-0 CT1 (SUTURE) ×4 IMPLANT
SUT VIC AB 2-0 CT1 27 (SUTURE) ×4
SUT VIC AB 2-0 CT1 TAPERPNT 27 (SUTURE) ×3 IMPLANT
SUT VIC AB 3-0 SH 27 (SUTURE)
SUT VIC AB 3-0 SH 27X BRD (SUTURE) IMPLANT
SYR CONTROL 10ML LL (SYRINGE) ×4 IMPLANT
TOWEL OR 17X24 6PK STRL BLUE (TOWEL DISPOSABLE) ×8 IMPLANT
TRAY DSU PREP LF (CUSTOM PROCEDURE TRAY) ×4 IMPLANT
TRENDGUARD 600 HYBRID PROC PK (MISCELLANEOUS)
TUBE CONNECTING 12X1/4 (SUCTIONS) IMPLANT
TUBING HYDROFLEX HYSTEROSCOPY (TUBING) ×3 IMPLANT
WATER STERILE IRR 3000ML UROMA (IV SOLUTION) ×3 IMPLANT
WATER STERILE IRR 500ML POUR (IV SOLUTION) ×4 IMPLANT

## 2017-03-19 NOTE — Op Note (Signed)
Preoperative diagnosis: Abnormal uterine bleeding Postop diagnosis: Abnormal uterine bleeding Procedure: Total vaginal hysterectomy, bilateral salpingectomy and cystoscopy Surgeon: Cheri Fowler M.D. Assistant: Crawford Givens, D.O. Anesthesia: Gen. With an LMA Findings: Patient had a slightly enlarged uterus with small fibroids, normal tubes and ovaries with previous BTL. Via cystoscopy bladder was normal and both ureters were patent Estimated blood loss: 100 cc Specimens: Uterus and tubes for routine pathology Complications: None  Procedure in detail: The patient was taken to the operating room and placed in the dorsosupine position. General anesthesia was induced and she was placed in mobile stirrups. Legs were elevated in the stirrups. Perineum and vagina were then prepped and draped in the usual sterile fashion, bladder drained with red Robinson catheter. A Graves speculum was inserted in the vagina and the cervix was grasped with Ardis Hughs tenaculums. Dilute Pitressin with Marcaine was then instilled at the cervicovaginal junction which was then incised circumferentially with electrocautery. Sharp dissection was then used to further free the vagina from the cervix. Anterior peritoneum was identified and entered sharply. A Deaver retractor was used to retract the bladder anteriorly. Posterior cul-de-sac was identified and entered sharply. A Bonnano speculum was placed into the posterior cul-de-sac. Uterosacral ligaments were clamped transected and ligated with #1 chromic and tagged for later use. Remaining pedicles were taken down with the harmonic scalpel.  A small myoma on the left was removed with the specimen. Bleeding from each side was controlled with a figure-of-eight suture of #1 chromic. Tubes and ovaries were inspected and found to be normal. I was able to remove the proximal tubes with the uterus, and the distal tubes were easily visualized and removed with the harmonic scalpel.  The  uterosacral ligaments were then plicated in the midline with 2-0 silk after the Bonnano speculum was removed and a shorter vaginal speculum was placed. The previously tagged uterosacral pedicles were also tied in the midline. No significant bleeding was identified. The vagina was then closed in a vertical fashion with running locking 2-0 Vicryl with adequate closure and adequate hemostasis.  Attention was turned to cystoscopy. A 70 cystoscope was inserted and 100 cc of fluid was instilled. The bladder appeared normal. Both ureteral orifices were easily identified and urine was seen to flow freely from each orifice. The cystoscope was removed the bladder was drained with a red robinson catheter. The patient was taken down from stirrups. She was awakened in the operating room and taken to the recovery room in stable condition after tolerating the procedure well. Counts were correct, she had PAS hose on throughout the procedure, she received Ancef 2 gm preop.

## 2017-03-19 NOTE — Discharge Summary (Signed)
Physician Discharge Summary  Patient ID: [name] MRN: [id number] DOB/AGE: 1976/09/15 41 y.o.  Admit date: 03/19/2017 Discharge date: 03/19/2017  Admission Diagnoses:  AUB  Discharge Diagnoses: AUB, myomatous uterus Active Problems:   S/P vaginal hysterectomy   Discharged Condition: good  Hospital Course: Pt underwent TVH without difficulty.  By the evening of surgery, pain minimal, tol PO, voiding, ambulating, stable for discharge   Discharge Exam: Blood pressure (!) 141/94, pulse 82, temperature 98.8 F (37.1 C), temperature source Oral, resp. rate 16, height 5\' 5"  (1.651 m), weight 92.5 kg (204 lb), last menstrual period 03/19/2017, SpO2 98 %. General appearance: alert  Disposition:   Discharge Instructions    Call MD for:  difficulty breathing, headache or visual disturbances    Complete by:  As directed    Call MD for:  persistant dizziness or light-headedness    Complete by:  As directed    Call MD for:  persistant nausea and vomiting    Complete by:  As directed    Call MD for:  severe uncontrolled pain    Complete by:  As directed    Call MD for:  temperature >100.4    Complete by:  As directed    Diet - low sodium heart healthy    Complete by:  As directed    Increase activity slowly    Complete by:  As directed    Lifting restrictions    Complete by:  As directed    10 lbs   Sexual Activity Restrictions    Complete by:  As directed    Pelvic rest     Allergies as of 03/19/2017      Reactions   Sulfa Antibiotics Nausea Only      Medication List    STOP taking these medications   celecoxib 100 MG capsule Commonly known as:  CELEBREX   ibuprofen 200 MG tablet Commonly known as:  ADVIL,MOTRIN   norethindrone 5 MG tablet Commonly known as:  AYGESTIN     TAKE these medications   amoxicillin 500 MG tablet Commonly known as:  AMOXIL Take 500 mg by mouth 2 (two) times daily.   calcium carbonate 500 MG chewable tablet Commonly known as:  TUMS  - dosed in mg elemental calcium Chew 1 tablet by mouth as needed for indigestion or heartburn.   fexofenadine-pseudoephedrine 180-240 MG 24 hr tablet Commonly known as:  ALLEGRA-D 24 Take 1 tablet by mouth daily as needed.   gabapentin 300 MG capsule Commonly known as:  NEURONTIN Take 300 mg by mouth 3 (three) times daily.   prenatal vitamin w/FE, FA 27-1 MG Tabs tablet Take 1 tablet by mouth daily.      Follow-up Information    Mckenzie Bove, MD. Schedule an appointment as soon as possible for a visit in 6 week(s).   Specialty:  Obstetrics and Gynecology Contact information: [address] [place] [address] [place] [address] [phone]           Signed: Blane Ohara Shailey Butterbaugh 03/19/2017, 4:58 PM

## 2017-03-19 NOTE — Progress Notes (Signed)
Post-op check, TVH Doing well, minimal pain without meds, no n/v, voiding well Afeb, VSS No heavy vaginal bleeding Will d/c home, she declines Percocet for pain

## 2017-03-19 NOTE — Anesthesia Preprocedure Evaluation (Addendum)
Anesthesia Evaluation  Patient identified by MRN, date of birth, ID band Patient awake    Reviewed: Allergy & Precautions, NPO status , Patient's Chart, lab work & pertinent test results  Airway Mallampati: II  TM Distance: >3 FB Neck ROM: Full    Dental no notable dental hx.    Pulmonary former smoker,    Pulmonary exam normal breath sounds clear to auscultation       Cardiovascular negative cardio ROS Normal cardiovascular exam Rhythm:Regular Rate:Normal  ECG: NSR, rate 69  09-17-2016--- NORMAL  (NO ISCHEMIA AND NO EVIDENCE HEART ARTERY DISEASE) The stress test showed no EKG evidence to suggest heart artery disease. There were no arrhythmias noted either. Good exercise tolerance. Negative stress test   Neuro/Psych negative neurological ROS  negative psych ROS   GI/Hepatic Neg liver ROS, Reflux is diet controlled, no meds   Endo/Other  negative endocrine ROS  Renal/GU negative Renal ROS  negative genitourinary   Musculoskeletal negative musculoskeletal ROS (+)   Abdominal   Peds negative pediatric ROS (+)  Hematology negative hematology ROS (+)   Anesthesia Other Findings Obese, BMI 33  Reproductive/Obstetrics negative OB ROS                             Anesthesia Physical Anesthesia Plan  ASA: II  Anesthesia Plan: General   Post-op Pain Management:    Induction: Intravenous  Airway Management Planned: LMA  Additional Equipment:   Intra-op Plan:   Post-operative Plan: Extubation in OR  Informed Consent: I have reviewed the patients History and Physical, chart, labs and discussed the procedure including the risks, benefits and alternatives for the proposed anesthesia with the patient or authorized representative who has indicated his/her understanding and acceptance.   Dental advisory given  Plan Discussed with: CRNA and Surgeon  Anesthesia Plan Comments:         Anesthesia Quick Evaluation

## 2017-03-19 NOTE — Transfer of Care (Addendum)
Immediate Anesthesia Transfer of Care Note  Patient: [name]  Procedure(s) Performed: Procedure(s) with comments: HYSTERECTOMY VAGINAL (N/A) - pt will be OIB BILATERAL SALPINGECTOMY (Bilateral) CYSTOSCOPY (N/A)  Patient Location: PACU  Anesthesia Type:General  Level of Consciousness: awake, alert  and oriented  Airway & Oxygen Therapy: Patient Spontanous Breathing and Patient connected to nasal cannula oxygen  Post-op Assessment: Report given to RN  Post vital signs: Reviewed and stable  Last Vitals:148/95,  77, 10, 100%, 98.6 Vitals:   03/19/17 0620  BP: 117/74  Pulse: 81  Resp: 16  Temp: 36.8 C    Last Pain:  Vitals:   03/19/17 0620  TempSrc: Oral  PainSc: 1       Patients Stated Pain Goal: 7 (18/86/77 3736)  Complications: No apparent anesthesia complications

## 2017-03-19 NOTE — Discharge Instructions (Signed)
Routine instructions for vaginal hysterectomy

## 2017-03-19 NOTE — Interval H&P Note (Signed)
History and Physical Interval Note:  03/19/2017 7:17 AM  [name]  has presented today for surgery, with the diagnosis of AUB  The various methods of treatment have been discussed with the patient and family. After consideration of risks, benefits and other options for treatment, the patient has consented to  Procedure(s) with comments: HYSTERECTOMY VAGINAL (N/A) - pt will be OIB POSSIBLE BILATERAL SALPINGECTOMY (Bilateral) POSSIBLE CYSTOSCOPY (N/A) as a surgical intervention .  The patient's history has been reviewed, patient examined, no change in status, stable for surgery.  I have reviewed the patient's chart and labs.  Questions were answered to the patient's satisfaction.     [name]

## 2017-03-19 NOTE — Anesthesia Postprocedure Evaluation (Signed)
Anesthesia Post Note  Patient: [name]  Procedure(s) Performed: Procedure(s) (LRB): HYSTERECTOMY VAGINAL (N/A) BILATERAL SALPINGECTOMY (Bilateral) CYSTOSCOPY (N/A)  Patient location during evaluation: PACU Anesthesia Type: General Level of consciousness: awake and alert Pain management: pain level controlled Vital Signs Assessment: post-procedure vital signs reviewed and stable Respiratory status: spontaneous breathing, nonlabored ventilation, respiratory function stable and patient connected to nasal cannula oxygen Cardiovascular status: blood pressure returned to baseline and stable Postop Assessment: no signs of nausea or vomiting Anesthetic complications: no       Last Vitals:  Vitals:   03/19/17 0853 03/19/17 1012  BP: (!) 148/95 (!) 150/99  Pulse: 70 77  Resp: 10 18  Temp: 37 C 36.7 C    Last Pain:  Vitals:   03/19/17 1012  TempSrc:   PainSc: 2                  [name]

## 2017-03-20 LAB — POCT HEMOGLOBIN-HEMACUE: HEMOGLOBIN: 13.4 g/dL (ref 12.0–15.0)

## 2017-03-24 ENCOUNTER — Encounter (HOSPITAL_BASED_OUTPATIENT_CLINIC_OR_DEPARTMENT_OTHER): Payer: Self-pay | Admitting: Obstetrics and Gynecology

## 2017-09-28 IMAGING — DX DG CHEST 2V
2 series · 2 of 2 positions shown · non-contrast
Comparison: 09/15/2005

CLINICAL DATA: Chest pain and cough 2 months

EXAM:
CHEST  2 VIEW

[chest pa]
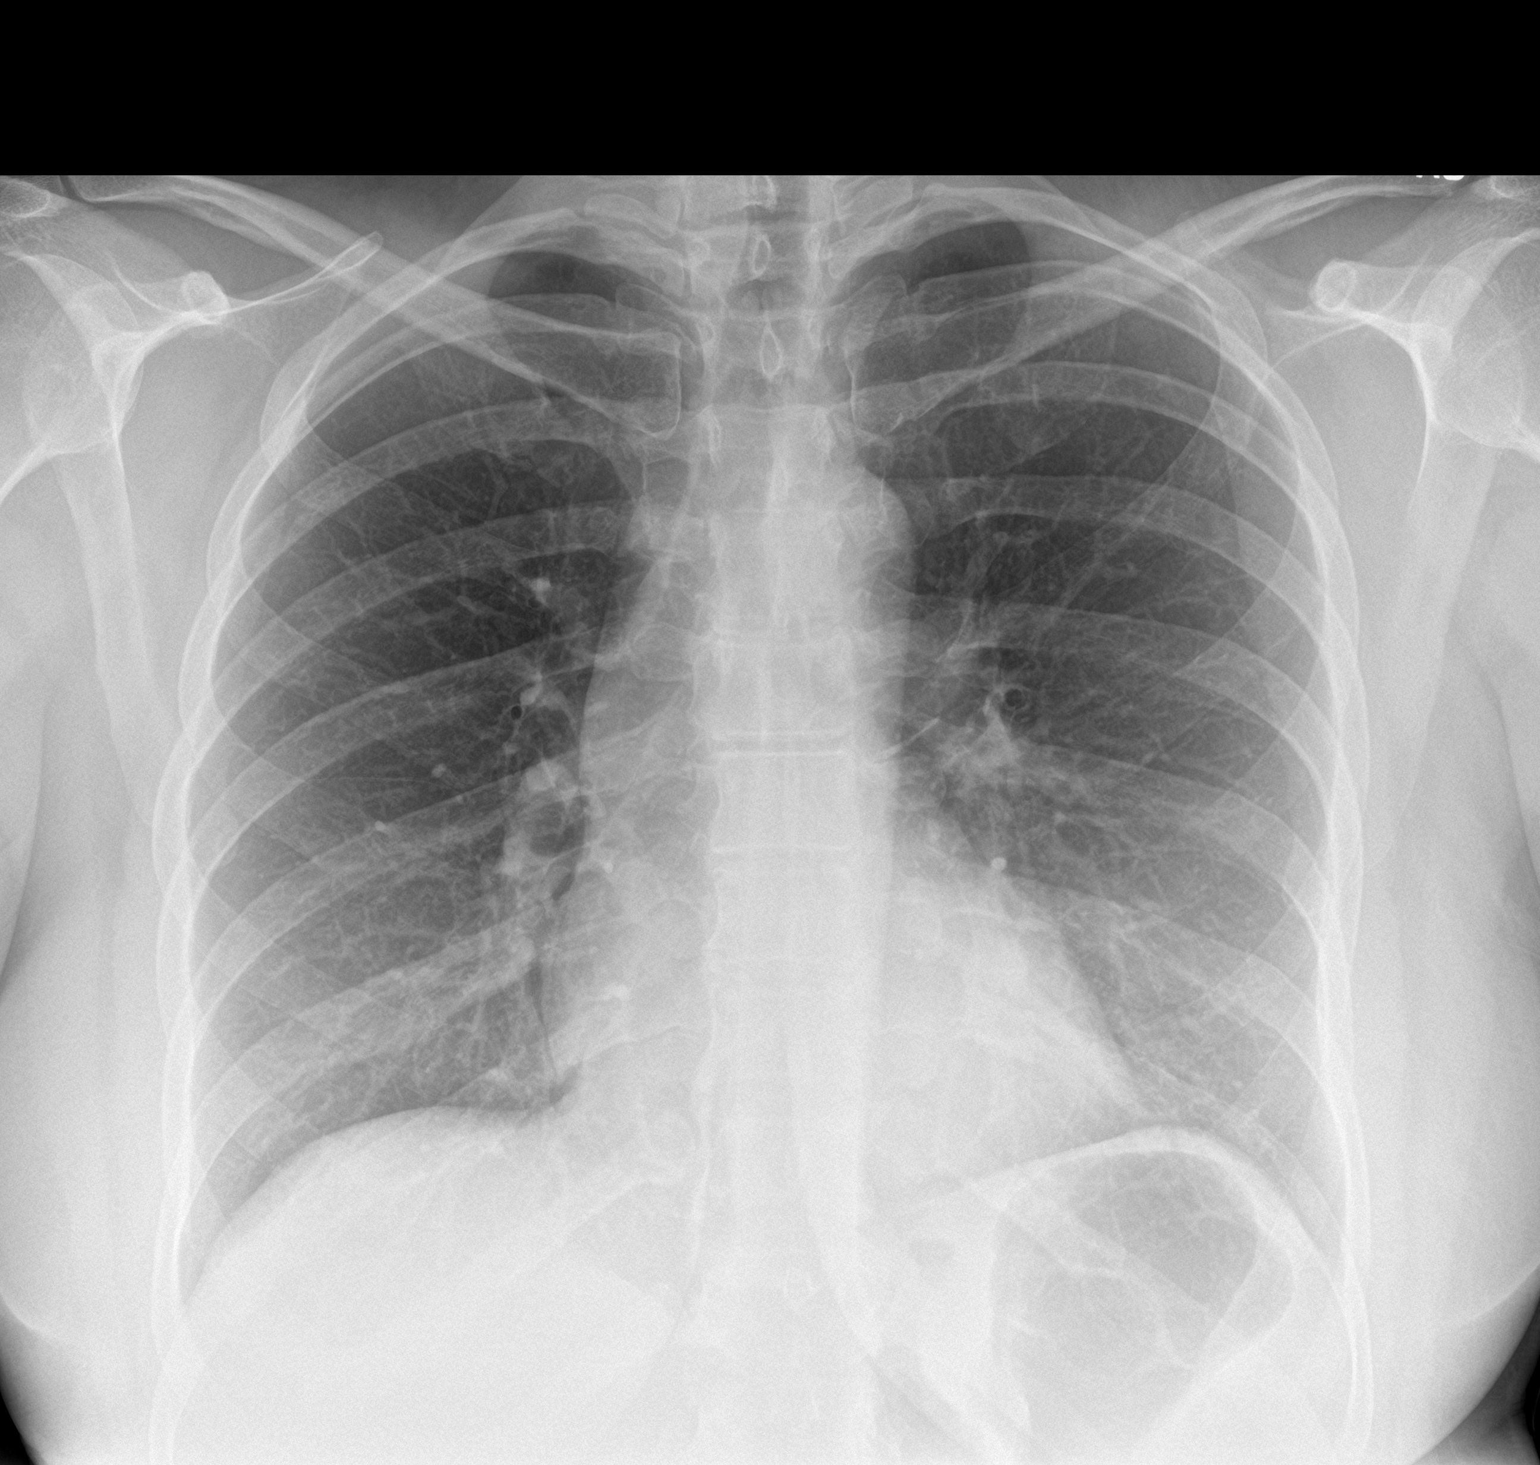

[chest lat]
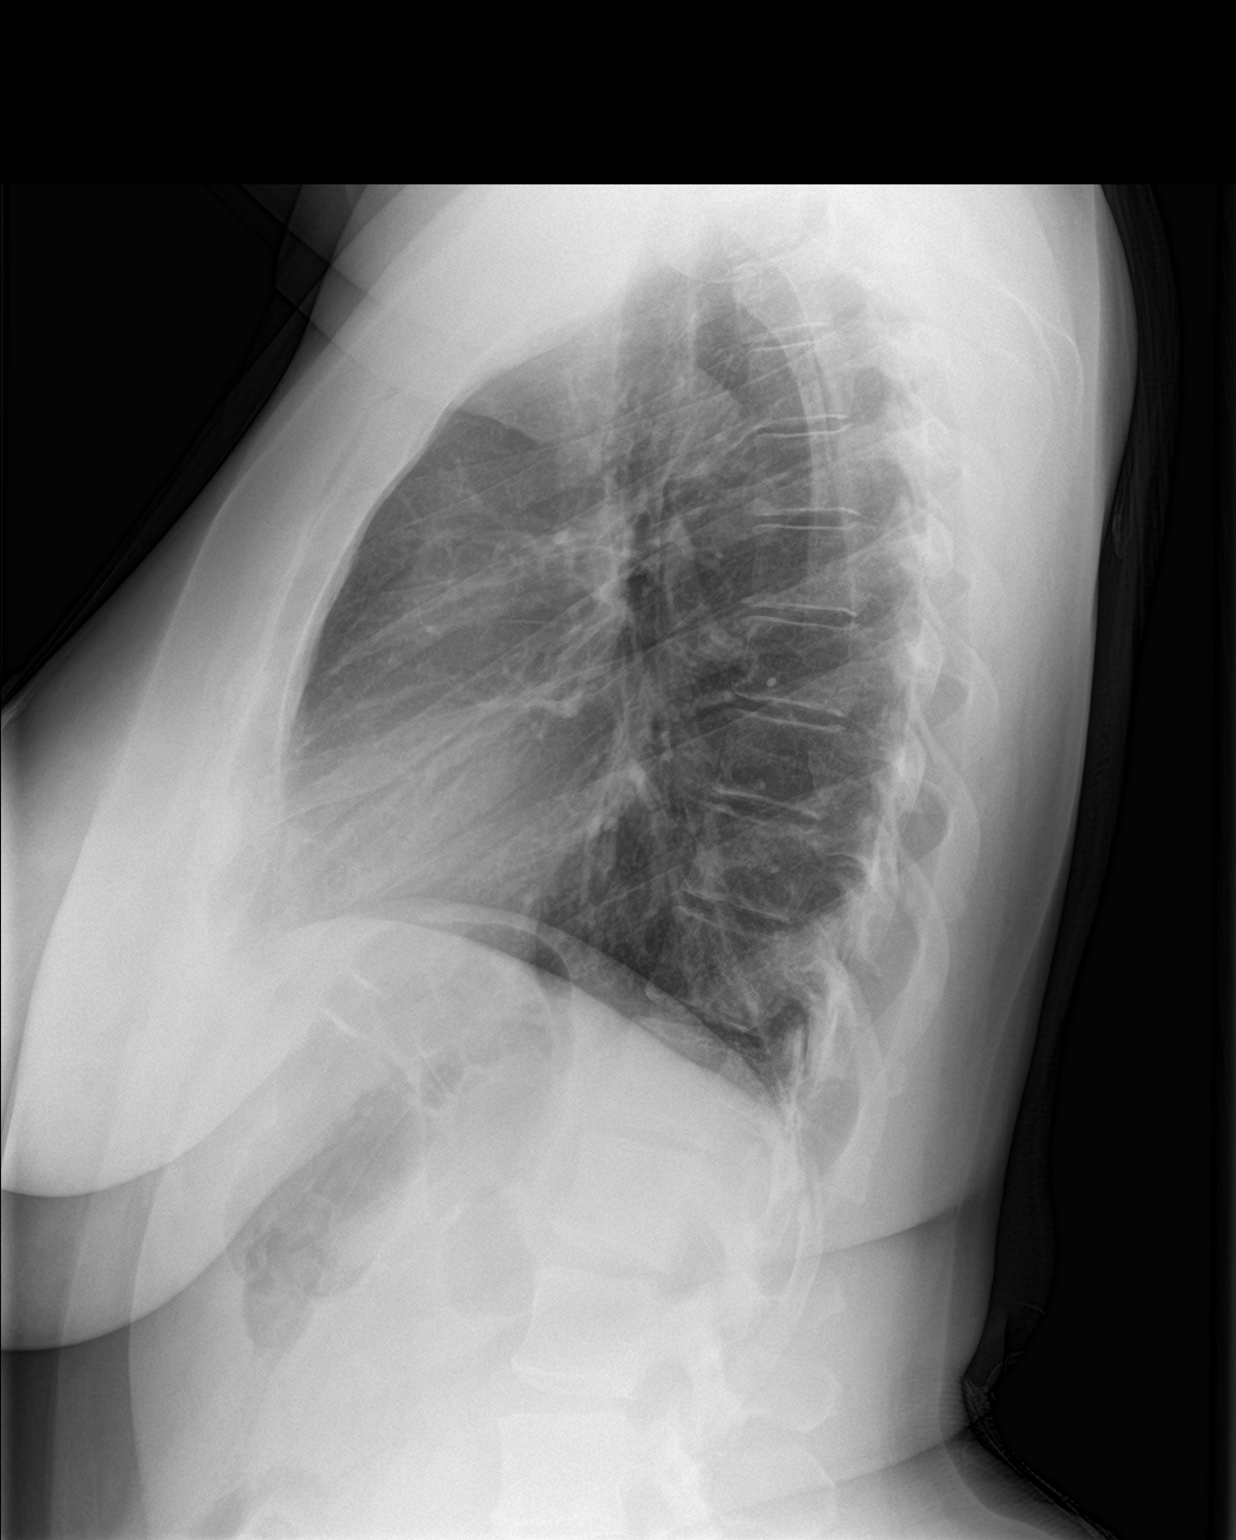

[2 of 2 positions shown; findings below may reference images not displayed]

FINDINGS: The heart size and mediastinal contours are within normal limits.
Both lungs are clear. The visualized skeletal structures are
unremarkable.
IMPRESSION: No active cardiopulmonary disease.

## 2017-12-30 ENCOUNTER — Encounter: Payer: Self-pay | Admitting: Podiatry

## 2018-01-11 ENCOUNTER — Ambulatory Visit: Payer: Self-pay | Admitting: Podiatry

## 2018-01-14 ENCOUNTER — Ambulatory Visit (INDEPENDENT_AMBULATORY_CARE_PROVIDER_SITE_OTHER): Payer: BLUE CROSS/BLUE SHIELD

## 2018-01-14 ENCOUNTER — Encounter: Payer: Self-pay | Admitting: Podiatry

## 2018-01-14 ENCOUNTER — Ambulatory Visit: Payer: BLUE CROSS/BLUE SHIELD | Admitting: Podiatry

## 2018-01-14 ENCOUNTER — Other Ambulatory Visit: Payer: Self-pay | Admitting: Podiatry

## 2018-01-14 DIAGNOSIS — M21622 Bunionette of left foot: Secondary | ICD-10-CM | POA: Diagnosis not present

## 2018-01-14 DIAGNOSIS — M722 Plantar fascial fibromatosis: Secondary | ICD-10-CM

## 2018-01-14 DIAGNOSIS — M79672 Pain in left foot: Secondary | ICD-10-CM

## 2018-01-14 DIAGNOSIS — M79671 Pain in right foot: Secondary | ICD-10-CM

## 2018-01-14 MED ORDER — TRIAMCINOLONE ACETONIDE 10 MG/ML IJ SUSP
10.0000 mg | Freq: Once | INTRAMUSCULAR | Status: AC
Start: 2018-01-14 — End: 2018-01-14
  Administered 2018-01-14: 10 mg

## 2018-01-14 MED ORDER — DICLOFENAC SODIUM 75 MG PO TBEC: 75.0000 mg | DELAYED_RELEASE_TABLET | Freq: Two times a day (BID) | ORAL | 2 refills | Status: DC

## 2018-01-14 NOTE — Progress Notes (Signed)
Subjective:   Patient ID: [name], female   DOB: 42 y.o.   MRN: [id number]   HPI Patient states she has several months of intense discomfort plantar aspect left heel and that she has orthotics that are 43 years old and worn out and are no longer comfortable.  Patient states the heel is gotten worse and is worse when she gets up in the morning after sitting and patient does not smoke and likes to be active   Review of Systems  All other systems reviewed and are negative.       Objective:  Physical Exam  Constitutional: She appears well-developed and well-nourished.  Cardiovascular: Intact distal pulses.  Pulmonary/Chest: Effort normal.  Musculoskeletal: Normal range of motion.  Neurological: She is alert.  Skin: Skin is warm.  Nursing note and vitals reviewed.   Neurovascular status intact muscle strength adequate range of motion within normal limits with patient noted to have acute inflammation pain plantar aspect left heel at the insertional point of the tendon into the calcaneus with fluid buildup around the tendon.  Patient is noted to have moderate depression of the arch has good digital perfusion well oriented x3     Assessment:  Acute plantar fasciitis left heel that is very painful when pressed with structural deformity of the arch as part of the complicating factor     Plan:  H&P condition reviewed and injected the left plantar fascia 3 mg Kenalog 5 mg Xylocaine and applied fascial brace.  Did go ahead and scan for customized orthotics to try to take the pressure off the feet and she did have slight discomfort in the outside the fifth metatarsal with fluid buildup and I did do a very small injection consisting of quarter cc dexamethasone Kenalog to reduce the inflammation  X-ray indicates there is a spur formation with no indication to stress fracture arthritis with moderate depression of the arch noted upon radiographic appearance

## 2018-01-14 NOTE — Patient Instructions (Signed)

## 2018-02-10 ENCOUNTER — Encounter: Payer: BLUE CROSS/BLUE SHIELD | Admitting: Orthotics

## 2018-02-11 ENCOUNTER — Ambulatory Visit: Payer: BLUE CROSS/BLUE SHIELD | Admitting: Nurse Practitioner

## 2018-02-17 ENCOUNTER — Other Ambulatory Visit (INDEPENDENT_AMBULATORY_CARE_PROVIDER_SITE_OTHER): Payer: BLUE CROSS/BLUE SHIELD

## 2018-02-17 ENCOUNTER — Ambulatory Visit: Payer: BLUE CROSS/BLUE SHIELD | Admitting: Family

## 2018-02-17 ENCOUNTER — Encounter: Payer: Self-pay | Admitting: Family

## 2018-02-17 VITALS — BP 116/80 | HR 84 | Temp 98.0°F | Ht 65.0 in | Wt 208.0 lb

## 2018-02-17 DIAGNOSIS — G43819 Other migraine, intractable, without status migrainosus: Secondary | ICD-10-CM

## 2018-02-17 DIAGNOSIS — Z23 Encounter for immunization: Secondary | ICD-10-CM

## 2018-02-17 DIAGNOSIS — Z13220 Encounter for screening for lipoid disorders: Secondary | ICD-10-CM | POA: Diagnosis not present

## 2018-02-17 DIAGNOSIS — N951 Menopausal and female climacteric states: Secondary | ICD-10-CM

## 2018-02-17 DIAGNOSIS — G43909 Migraine, unspecified, not intractable, without status migrainosus: Secondary | ICD-10-CM | POA: Insufficient documentation

## 2018-02-17 DIAGNOSIS — Z0000 Encounter for general adult medical examination without abnormal findings: Secondary | ICD-10-CM

## 2018-02-17 LAB — CBC WITH DIFFERENTIAL/PLATELET
BASOS PCT: 1.2 % (ref 0.0–3.0)
Basophils Absolute: 0.1 10*3/uL (ref 0.0–0.1)
EOS PCT: 2 % (ref 0.0–5.0)
Eosinophils Absolute: 0.1 10*3/uL (ref 0.0–0.7)
HCT: 45 % (ref 36.0–46.0)
HEMOGLOBIN: 15.4 g/dL — AB (ref 12.0–15.0)
LYMPHS ABS: 2.1 10*3/uL (ref 0.7–4.0)
Lymphocytes Relative: 33.6 % (ref 12.0–46.0)
MCHC: 34.3 g/dL (ref 30.0–36.0)
MCV: 97.4 fl (ref 78.0–100.0)
MONO ABS: 0.4 10*3/uL (ref 0.1–1.0)
Monocytes Relative: 7 % (ref 3.0–12.0)
Neutro Abs: 3.5 10*3/uL (ref 1.4–7.7)
Neutrophils Relative %: 56.2 % (ref 43.0–77.0)
Platelets: 295 10*3/uL (ref 150.0–400.0)
RBC: 4.62 Mil/uL (ref 3.87–5.11)
RDW: 12.7 % (ref 11.5–15.5)
WBC: 6.2 10*3/uL (ref 4.0–10.5)

## 2018-02-17 LAB — COMPREHENSIVE METABOLIC PANEL
ALT: 23 U/L (ref 0–35)
AST: 15 U/L (ref 0–37)
Albumin: 4.4 g/dL (ref 3.5–5.2)
Alkaline Phosphatase: 89 U/L (ref 39–117)
BUN: 14 mg/dL (ref 6–23)
CHLORIDE: 101 meq/L (ref 96–112)
CO2: 30 meq/L (ref 19–32)
Calcium: 10 mg/dL (ref 8.4–10.5)
Creatinine, Ser: 0.84 mg/dL (ref 0.40–1.20)
GFR: 78.74 mL/min (ref >60.00)
Glucose, Bld: 86 mg/dL (ref 70–99)
POTASSIUM: 4.3 meq/L (ref 3.5–5.1)
SODIUM: 138 meq/L (ref 135–145)
Total Bilirubin: 0.6 mg/dL (ref 0.2–1.2)
Total Protein: 7.7 g/dL (ref 6.0–8.3)

## 2018-02-17 LAB — LIPID PANEL
Cholesterol: 208 mg/dL — ABNORMAL HIGH (ref 0–200)
HDL: 54 mg/dL (ref >39.00)
LDL Cholesterol: 132 mg/dL — ABNORMAL HIGH (ref 0–99)
NONHDL: 154.26
Total CHOL/HDL Ratio: 4
Triglycerides: 112 mg/dL (ref 0.0–149.0)
VLDL: 22.4 mg/dL (ref 0.0–40.0)

## 2018-02-17 LAB — TSH: TSH: 0.95 u[IU]/mL (ref 0.35–4.50)

## 2018-02-17 LAB — FOLLICLE STIMULATING HORMONE: FSH: 10.6 m[IU]/mL

## 2018-02-17 LAB — LUTEINIZING HORMONE: LH: 8.01 m[IU]/mL

## 2018-02-17 NOTE — Progress Notes (Signed)
[name] is a 42 y.o. female with the following history as recorded in [employer]:  Patient Active Problem List   Diagnosis Date Noted  . S/P vaginal hysterectomy 03/19/2017  . Acute upper respiratory infection 09/29/2016  . Costochondritis 09/09/2016  . Cough 09/05/2016  . EUSTACHIAN TUBE DYSFUNCTION, RIGHT 07/04/2009  . Menorrhagia with irregular cycle 12/29/2007  . KNEE PAIN 08/23/2007  . Asthma 01/11/2007    Current Outpatient Medications  Medication Sig Dispense Refill  . calcium carbonate (TUMS - DOSED IN MG ELEMENTAL CALCIUM) 500 MG chewable tablet Chew 1 tablet by mouth daily.    . fexofenadine-pseudoephedrine (ALLEGRA-D 24) 180-240 MG 24 hr tablet Take 1 tablet by mouth daily as needed.     No current facility-administered medications for this visit.     Allergies: Sulfa antibiotics  Past Medical History:  Diagnosis Date  . Abnormal uterine bleeding (AUB)   . Eczema   . GERD (gastroesophageal reflux disease)   . History of exercise stress test    09-17-2016--- NORMAL  (NO ISCHEMIA AND NO EVIDENCE HEART ARTERY DISEASE)  . Seasonal allergies     Past Surgical History:  Procedure Laterality Date  . BILATERAL SALPINGECTOMY Bilateral 03/19/2017   Procedure: BILATERAL SALPINGECTOMY;  Surgeon: Cheri Fowler, MD;  Location: [facility];  Service: Gynecology;  Laterality: Bilateral;  . CERVICAL BIOPSY  W/ LOOP ELECTRODE EXCISION  yrs ago  . CYSTOSCOPY N/A 03/19/2017   Procedure: CYSTOSCOPY;  Surgeon: Cheri Fowler, MD;  Location: [facility];  Service: Gynecology;  Laterality: N/A;  . D & C HYSTEROSCOPY / POLYPECTOMY/  ENDOMETRIAL ABLATION  05/27/2016  . DX LAPAROSCOPY  01/26/2003  . HIP ARTHROSCOPY W/ LABRAL DEBRIDEMENT Left 06/14/2003   and Chondroplasty  . KNEE ARTHROSCOPY W/ ACL RECONSTRUCTION Bilateral left 1992/  right 2016  . KNEE ARTHROSCOPY W/ MEDIAL COLLATERAL LIGAMENT (MCL) REPAIR Right 2017  . LAPAROSCOPIC TUBAL LIGATION  Bilateral 11/11/2005  . VAGINAL HYSTERECTOMY N/A 03/19/2017   Procedure: HYSTERECTOMY VAGINAL;  Surgeon: Cheri Fowler, MD;  Location: [facility];  Service: Gynecology;  Laterality: N/A;  pt will be OIB    Family History  Problem Relation Age of Onset  . Diabetes Maternal Grandmother   . Heart disease Maternal Grandfather   . Stroke Maternal Grandfather   . Cancer Maternal Grandfather   . Diabetes Maternal Grandfather   . Hypertension Maternal Grandfather   . Heart disease Paternal Grandmother   . Hypertension Paternal Grandmother   . Cancer Mother 57       urethra cancer with metastasis    Social History   Tobacco Use  . Smoking status: Former Smoker    Years: 15.00    Types: Cigarettes    Last attempt to quit: 2006    Years since quitting: 13.3  . Smokeless tobacco: Never Used  Substance Use Topics  . Alcohol use: Yes    Alcohol/week: 2.4 oz    Types: 4 Standard drinks or equivalent per week    Comment: occasional    Subjective:  Patient presents for her yearly CPE; in baseline state of health today with no complaints;   Pap smear- s/p hysterectomy 2018; under care of GYN Mammogram- per GYN; scheduled 02/2018 Tdap- 01/2018 Up to date on dentist; will be scheduling with eye doctor Sleeping well;   Objective:  Vitals:   02/17/18 0821  BP: 116/80  Pulse: 84  Temp: 98 F (36.7 C)  TempSrc: Oral  SpO2: 97%  Weight: 208 lb (  94.3 kg)  Height: '5\' 5"'  (1.651 m)    General: Well developed, well nourished, in no acute distress  Skin : Warm and dry.  Head: Normocephalic and atraumatic  Eyes: Sclera and conjunctiva clear; pupils round and reactive to light; extraocular movements intact  Ears: External normal; canals clear; tympanic membranes normal  Oropharynx: Pink, supple. No suspicious lesions  Neck: Supple without thyromegaly, adenopathy  Lungs: Respirations unlabored; clear to auscultation bilaterally without wheeze, rales, rhonchi  CVS exam:  normal rate and regular rhythm.  Abdomen: Soft; nontender; nondistended; normoactive bowel sounds; no masses or hepatosplenomegaly  Musculoskeletal: No deformities; no active joint inflammation  Extremities: No edema, cyanosis, clubbing  Vessels: Symmetric bilaterally  Neurologic: Alert and oriented; speech intact; face symmetrical; moves all extremities well; CNII-XII intact without focal deficit  Assessment:  1. PE (physical exam), annual   2. Menopausal symptoms   3. Lipid screening     Plan:  Labs updated; Tdap updated; keep planned follow-up with GYN; encouraged exercise/ weight loss/ healthy eating; follow-up in 1 year, sooner prn based on labs.    No follow-ups on file.  Orders Placed This Encounter  Procedures  . Tdap vaccine greater than or equal to 7yo IM  . [place]    Standing Status:   Future    Standing Expiration Date:   02/17/2019  . LH    Standing Status:   Future    Standing Expiration Date:   02/17/2019  . CBC w/Diff    Standing Status:   Future    Standing Expiration Date:   02/17/2019  . Comp Met (CMET)    Standing Status:   Future    Standing Expiration Date:   02/17/2019  . Lipid panel    Standing Status:   Future    Standing Expiration Date:   02/18/2019  . TSH    Standing Status:   Future    Standing Expiration Date:   02/17/2019    Requested Prescriptions    No prescriptions requested or ordered in this encounter

## 2018-02-25 DIAGNOSIS — J029 Acute pharyngitis, unspecified: Secondary | ICD-10-CM | POA: Diagnosis not present

## 2018-05-04 DIAGNOSIS — Z6834 Body mass index (BMI) 34.0-34.9, adult: Secondary | ICD-10-CM | POA: Diagnosis not present

## 2018-05-04 DIAGNOSIS — N904 Leukoplakia of vulva: Secondary | ICD-10-CM | POA: Insufficient documentation

## 2018-05-04 DIAGNOSIS — Z130 Encounter for screening for diseases of the blood and blood-forming organs and certain disorders involving the immune mechanism: Secondary | ICD-10-CM | POA: Diagnosis not present

## 2018-05-04 DIAGNOSIS — Z1389 Encounter for screening for other disorder: Secondary | ICD-10-CM | POA: Diagnosis not present

## 2018-05-04 DIAGNOSIS — Z01419 Encounter for gynecological examination (general) (routine) without abnormal findings: Secondary | ICD-10-CM | POA: Diagnosis not present

## 2018-05-04 DIAGNOSIS — Z1231 Encounter for screening mammogram for malignant neoplasm of breast: Secondary | ICD-10-CM | POA: Diagnosis not present

## 2018-08-19 DIAGNOSIS — M25511 Pain in right shoulder: Secondary | ICD-10-CM | POA: Diagnosis not present

## 2018-10-12 ENCOUNTER — Ambulatory Visit: Payer: BLUE CROSS/BLUE SHIELD | Admitting: Family

## 2018-10-12 ENCOUNTER — Encounter: Payer: Self-pay | Admitting: Family

## 2018-10-12 VITALS — BP 124/78 | HR 101 | Temp 97.7°F | Ht 65.0 in | Wt 218.0 lb

## 2018-10-12 DIAGNOSIS — J9801 Acute bronchospasm: Secondary | ICD-10-CM

## 2018-10-12 NOTE — Patient Instructions (Signed)
Bronchospasm, Adult Bronchospasm is when airways in the lungs get smaller. When this happens, it can be hard to breathe. You may cough. You may also make a whistling sound when you breathe (wheeze). Follow these instructions at home: Medicines  Take over-the-counter and prescription medicines only as told by your doctor.  If you need to use an inhaler or nebulizer to take your medicine, ask your doctor how to use it.  If you were given a spacer, always use it with your inhaler. Lifestyle  Change your heating and air conditioning filter. Do this at least once a month.  Try not to use fireplaces and wood stoves.  Do not  smoke. Do not  allow smoking in your home.  Try not to use things that have a strong smell, like perfume.  Get rid of pests (such as roaches and mice) and their poop.  Remove any mold from your home.  Keep your house clean. Get rid of dust.  Use cleaning products that have no smell.  Replace carpet with wood, tile, or vinyl flooring.  Use allergy-proof pillows, mattress covers, and box spring covers.  Wash bed sheets and blankets every week. Use hot water. Dry them in a dryer.  Use blankets that are made of polyester or cotton.  Wash your hands often.  Keep pets out of your bedroom.  When you exercise, try not to breathe in cold air. General instructions  Have a plan for getting medical care. Know these things: ? When to call your doctor. ? When to call local emergency services (911 in the [place] ? Where to go in an emergency.  Stay up to date on your shots (immunizations).  When you have an episode: ? Stay calm. ? Relax. ? Breathe slowly. Contact a doctor if:  Your muscles ache.  Your chest hurts.  The color of the mucus you cough up (sputum) changes from clear or white to yellow, green, gray, or bloody.  The mucus you cough up gets thicker.  You have a fever. Get help right away if:  The whistling sound gets worse, even after you  take your medicines.  Your coughing gets worse.  You find it even harder to breathe.  Your chest hurts very much. Summary  Bronchospasm is when airways in the lungs get smaller.  When this happens, it can be hard to breathe. You may cough. You may also make a whistling sound when you breathe.  Stay away from things that cause you to have episodes. These include smoke or dust. This information is not intended to replace advice given to you by your health care provider. Make sure you discuss any questions you have with your health care provider. Document Released: 08/10/2009 Document Revised: 10/16/2016 Document Reviewed: 10/16/2016 Elsevier Interactive Patient Education  2017 Elsevier Inc.  

## 2018-10-12 NOTE — Progress Notes (Signed)
[name] is a 42 y.o. female with the following history as recorded in EpicCare:  Patient Active Problem List   Diagnosis Date Noted  . Migraine headache 02/17/2018  . S/P vaginal hysterectomy 03/19/2017  . Acute upper respiratory infection 09/29/2016  . Costochondritis 09/09/2016  . Cough 09/05/2016  . EUSTACHIAN TUBE DYSFUNCTION, RIGHT 07/04/2009  . Menorrhagia with irregular cycle 12/29/2007  . KNEE PAIN 08/23/2007  . Asthma 01/11/2007    Current Outpatient Medications  Medication Sig Dispense Refill  . calcium carbonate (TUMS - DOSED IN MG ELEMENTAL CALCIUM) 500 MG chewable tablet Chew 1 tablet by mouth daily.    . clobetasol ointment (TEMOVATE) 0.05 % clobetasol 0.05 % topical ointment  Apply to affected areas bid prn    . fexofenadine-pseudoephedrine (ALLEGRA-D 24) 180-240 MG 24 hr tablet Take 1 tablet by mouth daily as needed.     No current facility-administered medications for this visit.     Allergies: Sulfa antibiotics  Past Medical History:  Diagnosis Date  . Abnormal uterine bleeding (AUB)   . Eczema   . GERD (gastroesophageal reflux disease)   . History of exercise stress test    09-17-2016--- NORMAL  (NO ISCHEMIA AND NO EVIDENCE HEART ARTERY DISEASE)  . Seasonal allergies     Past Surgical History:  Procedure Laterality Date  . BILATERAL SALPINGECTOMY Bilateral 03/19/2017   Procedure: BILATERAL SALPINGECTOMY;  Surgeon: Cheri Fowler, MD;  Location: [facility];  Service: Gynecology;  Laterality: Bilateral;  . CERVICAL BIOPSY  W/ LOOP ELECTRODE EXCISION  yrs ago  . CYSTOSCOPY N/A 03/19/2017   Procedure: CYSTOSCOPY;  Surgeon: Cheri Fowler, MD;  Location: [facility];  Service: Gynecology;  Laterality: N/A;  . D & C HYSTEROSCOPY / POLYPECTOMY/  ENDOMETRIAL ABLATION  05/27/2016  . DX LAPAROSCOPY  01/26/2003  . HIP ARTHROSCOPY W/ LABRAL DEBRIDEMENT Left 06/14/2003   and Chondroplasty  . KNEE ARTHROSCOPY W/ ACL RECONSTRUCTION  Bilateral left 1992/  right 2016  . KNEE ARTHROSCOPY W/ MEDIAL COLLATERAL LIGAMENT (MCL) REPAIR Right 2017  . LAPAROSCOPIC TUBAL LIGATION Bilateral 11/11/2005  . VAGINAL HYSTERECTOMY N/A 03/19/2017   Procedure: HYSTERECTOMY VAGINAL;  Surgeon: Cheri Fowler, MD;  Location: [facility];  Service: Gynecology;  Laterality: N/A;  pt will be OIB    Family History  Problem Relation Age of Onset  . Diabetes Maternal Grandmother   . Heart disease Maternal Grandfather   . Stroke Maternal Grandfather   . Cancer Maternal Grandfather   . Diabetes Maternal Grandfather   . Hypertension Maternal Grandfather   . Heart disease Paternal Grandmother   . Hypertension Paternal Grandmother   . Cancer Mother 62       urethra cancer with metastasis    Social History   Tobacco Use  . Smoking status: Former Smoker    Years: 15.00    Types: Cigarettes    Last attempt to quit: 2006    Years since quitting: 13.9  . Smokeless tobacco: Never Used  Substance Use Topics  . Alcohol use: Yes    Alcohol/week: 4.0 standard drinks    Types: 4 Standard drinks or equivalent per week    Comment: occasional    Subjective:  Patient presents with concerns for persisting cough x 3 weeks; was sick prior to onset of symptoms; now left with barking cough/ not productive; no fever; up at night coughing- waking up at night with coughing fits; used rescue inhaler on Saturday with some relief;    Objective:  Vitals:   10/12/18 1315  BP: 124/78  Pulse: (!) 101  Temp: 97.7 F (36.5 C)  TempSrc: Oral  SpO2: 97%  Weight: 218 lb 0.6 oz (98.9 kg)  Height: 5\' 5"  (1.651 m)    General: Well developed, well nourished, in no acute distress  Skin : Warm and dry.  Head: Normocephalic and atraumatic  Eyes: Sclera and conjunctiva clear; pupils round and reactive to light; extraocular movements intact  Ears: External normal; canals clear; tympanic membranes normal  Oropharynx: Pink, supple. No suspicious lesions   Neck: Supple without thyromegaly, adenopathy  Lungs: Respirations unlabored; clear to auscultation bilaterally without wheeze, rales, rhonchi  CVS exam: normal rate and regular rhythm.  Neurologic: Alert and oriented; speech intact; face symmetrical; moves all extremities well; CNII-XII intact without focal deficit   Assessment:  1. Acute bronchospasm     Plan:  Reassurance; do not feel antibiotic is needed; sample of Dulera 100 2 puffs bid x 7-10 days; follow-up worse, no better.   Keep planned follow-up for CPE in April 2020;   No follow-ups on file.  No orders of the defined types were placed in this encounter.   Requested Prescriptions    No prescriptions requested or ordered in this encounter

## 2018-10-19 ENCOUNTER — Ambulatory Visit (INDEPENDENT_AMBULATORY_CARE_PROVIDER_SITE_OTHER)
Admission: RE | Admit: 2018-10-19 | Discharge: 2018-10-19 | Disposition: A | Discharge status: Home / Self Care | Payer: BLUE CROSS/BLUE SHIELD | Source: Ambulatory Visit | Attending: Family | Admitting: Family

## 2018-10-19 ENCOUNTER — Encounter: Payer: Self-pay | Admitting: Family

## 2018-10-19 ENCOUNTER — Ambulatory Visit: Payer: BLUE CROSS/BLUE SHIELD | Admitting: Family

## 2018-10-19 VITALS — BP 126/82 | HR 83 | Temp 98.5°F | Ht 65.0 in

## 2018-10-19 DIAGNOSIS — R05 Cough: Secondary | ICD-10-CM | POA: Diagnosis not present

## 2018-10-19 DIAGNOSIS — R059 Cough, unspecified: Secondary | ICD-10-CM

## 2018-10-19 LAB — POCT EXHALED NITRIC OXIDE: FENO LEVEL (PPB): 20

## 2018-10-19 MED ORDER — PANTOPRAZOLE SODIUM 40 MG PO TBEC: 40.0000 mg | DELAYED_RELEASE_TABLET | Freq: Every day | ORAL | 2 refills | Status: DC

## 2018-10-19 NOTE — Progress Notes (Signed)
[name] is a 42 y.o. female with the following history as recorded in EpicCare:  Patient Active Problem List   Diagnosis Date Noted  . Migraine headache 02/17/2018  . S/P vaginal hysterectomy 03/19/2017  . Acute upper respiratory infection 09/29/2016  . Costochondritis 09/09/2016  . Cough 09/05/2016  . EUSTACHIAN TUBE DYSFUNCTION, RIGHT 07/04/2009  . Menorrhagia with irregular cycle 12/29/2007  . KNEE PAIN 08/23/2007  . Asthma 01/11/2007    Current Outpatient Medications  Medication Sig Dispense Refill  . calcium carbonate (TUMS - DOSED IN MG ELEMENTAL CALCIUM) 500 MG chewable tablet Chew 1 tablet by mouth daily.    . clobetasol ointment (TEMOVATE) 0.05 % clobetasol 0.05 % topical ointment  Apply to affected areas bid prn    . fexofenadine-pseudoephedrine (ALLEGRA-D 24) 180-240 MG 24 hr tablet Take 1 tablet by mouth daily as needed.    . pantoprazole (PROTONIX) 40 MG tablet Take 1 tablet (40 mg total) by mouth daily. 60 tablet 2   No current facility-administered medications for this visit.     Allergies: Sulfa antibiotics  Past Medical History:  Diagnosis Date  . Abnormal uterine bleeding (AUB)   . Eczema   . GERD (gastroesophageal reflux disease)   . History of exercise stress test    09-17-2016--- NORMAL  (NO ISCHEMIA AND NO EVIDENCE HEART ARTERY DISEASE)  . Seasonal allergies     Past Surgical History:  Procedure Laterality Date  . BILATERAL SALPINGECTOMY Bilateral 03/19/2017   Procedure: BILATERAL SALPINGECTOMY;  Surgeon: Cheri Fowler, MD;  Location: [facility];  Service: Gynecology;  Laterality: Bilateral;  . CERVICAL BIOPSY  W/ LOOP ELECTRODE EXCISION  yrs ago  . CYSTOSCOPY N/A 03/19/2017   Procedure: CYSTOSCOPY;  Surgeon: Cheri Fowler, MD;  Location: [facility];  Service: Gynecology;  Laterality: N/A;  . D & C HYSTEROSCOPY / POLYPECTOMY/  ENDOMETRIAL ABLATION  05/27/2016  . DX LAPAROSCOPY  01/26/2003  . HIP ARTHROSCOPY W/  LABRAL DEBRIDEMENT Left 06/14/2003   and Chondroplasty  . KNEE ARTHROSCOPY W/ ACL RECONSTRUCTION Bilateral left 1992/  right 2016  . KNEE ARTHROSCOPY W/ MEDIAL COLLATERAL LIGAMENT (MCL) REPAIR Right 2017  . LAPAROSCOPIC TUBAL LIGATION Bilateral 11/11/2005  . VAGINAL HYSTERECTOMY N/A 03/19/2017   Procedure: HYSTERECTOMY VAGINAL;  Surgeon: Cheri Fowler, MD;  Location[facility];  Service: Gynecology;  Laterality: N/A;  pt will be OIB    Family History  Problem Relation Age of Onset  . Diabetes Maternal Grandmother   . Heart disease Maternal Grandfather   . Stroke Maternal Grandfather   . Cancer Maternal Grandfather   . Diabetes Maternal Grandfather   . Hypertension Maternal Grandfather   . Heart disease Paternal Grandmother   . Hypertension Paternal Grandmother   . Cancer Mother 65       urethra cancer with metastasis    Social History   Tobacco Use  . Smoking status: Former Smoker    Years: 15.00    Types: Cigarettes    Last attempt to quit: 2006    Years since quitting: 13.9  . Smokeless tobacco: Never Used  Substance Use Topics  . Alcohol use: Yes    Alcohol/week: 4.0 standard drinks    Types: 4 Standard drinks or equivalent per week    Comment: occasional    Subjective:  Patient was seen last week and diagnosed with acute bronchospasm; still having problems at night; no fever, no chest pain, no shortness of breath; feels like she is only coughing when  she lies down; does have history of GERD- has started Prilosec but no benefit; notes that she is coughing so hard she has to vomit;    Objective:  Vitals:   10/19/18 0842  BP: 126/82  Pulse: 83  Temp: 98.5 F (36.9 C)  TempSrc: Oral  SpO2: 97%  Height: 5\' 5"  (1.651 m)    General: Well developed, well nourished, in no acute distress  Skin : Warm and dry.  Head: Normocephalic and atraumatic  Eyes: Sclera and conjunctiva clear; pupils round and reactive to light; extraocular movements intact   Ears: External normal; canals clear; tympanic membranes normal  Oropharynx: Pink, supple. No suspicious lesions  Neck: Supple without thyromegaly, adenopathy  Lungs: Respirations unlabored; clear to auscultation bilaterally without wheeze, rales, rhonchi  CVS exam: normal rate and regular rhythm.  Neurologic: Alert and oriented; speech intact; face symmetrical; moves all extremities well; CNII-XII intact without focal deficit   Assessment:  1. Cough     Plan:  Suspect GERD type symptoms from recent illness; FeNo score is 20; update CXR; continue BREO for now; add Protonix 40 mg bid x 7-10 days; follow-up worse, no better.   No follow-ups on file.  Orders Placed This Encounter  Procedures  . DG Chest 2 View    Standing Status:   Future    Number of Occurrences:   1    Standing Expiration Date:   12/21/2019    Order Specific Question:   Reason for Exam (SYMPTOM  OR DIAGNOSIS REQUIRED)    Answer:   cough    Order Specific Question:   Is patient pregnant?    Answer:   No    Order Specific Question:   Preferred imaging location?    Answer:   [facility]    Order Specific Question:   Radiology Contrast Protocol - do NOT remove file path    Answer:   [email or web address]    Requested Prescriptions   Signed Prescriptions Disp Refills  . pantoprazole (PROTONIX) 40 MG tablet 60 tablet 2    Sig: Take 1 tablet (40 mg total) by mouth daily.

## 2018-10-19 NOTE — Addendum Note (Signed)
Addended by: Marcina Millard on: 10/19/2018 09:36 AM   Modules accepted: Orders

## 2018-11-16 DIAGNOSIS — M25561 Pain in right knee: Secondary | ICD-10-CM | POA: Diagnosis not present

## 2018-11-16 DIAGNOSIS — M1712 Unilateral primary osteoarthritis, left knee: Secondary | ICD-10-CM | POA: Diagnosis not present

## 2018-11-16 DIAGNOSIS — M25562 Pain in left knee: Secondary | ICD-10-CM | POA: Insufficient documentation

## 2018-11-24 DIAGNOSIS — M25561 Pain in right knee: Secondary | ICD-10-CM | POA: Diagnosis not present

## 2018-11-30 DIAGNOSIS — M170 Bilateral primary osteoarthritis of knee: Secondary | ICD-10-CM | POA: Diagnosis not present

## 2018-11-30 DIAGNOSIS — M2241 Chondromalacia patellae, right knee: Secondary | ICD-10-CM | POA: Diagnosis not present

## 2018-12-13 ENCOUNTER — Encounter: Payer: Self-pay | Admitting: Family

## 2018-12-21 DIAGNOSIS — M170 Bilateral primary osteoarthritis of knee: Secondary | ICD-10-CM | POA: Diagnosis not present

## 2018-12-28 DIAGNOSIS — M170 Bilateral primary osteoarthritis of knee: Secondary | ICD-10-CM | POA: Diagnosis not present

## 2019-01-04 DIAGNOSIS — M170 Bilateral primary osteoarthritis of knee: Secondary | ICD-10-CM | POA: Diagnosis not present

## 2019-02-14 ENCOUNTER — Encounter: Payer: Self-pay | Admitting: Internal Medicine

## 2019-02-14 ENCOUNTER — Ambulatory Visit (INDEPENDENT_AMBULATORY_CARE_PROVIDER_SITE_OTHER): Payer: BLUE CROSS/BLUE SHIELD | Admitting: Internal Medicine

## 2019-02-14 ENCOUNTER — Encounter: Payer: Self-pay | Admitting: Family

## 2019-02-14 ENCOUNTER — Telehealth: Payer: BLUE CROSS/BLUE SHIELD | Admitting: Internal Medicine

## 2019-02-14 DIAGNOSIS — R5383 Other fatigue: Secondary | ICD-10-CM

## 2019-02-14 DIAGNOSIS — J301 Allergic rhinitis due to pollen: Secondary | ICD-10-CM

## 2019-02-14 DIAGNOSIS — J4520 Mild intermittent asthma, uncomplicated: Secondary | ICD-10-CM | POA: Diagnosis not present

## 2019-02-14 DIAGNOSIS — R0789 Other chest pain: Secondary | ICD-10-CM

## 2019-02-14 DIAGNOSIS — G4489 Other headache syndrome: Secondary | ICD-10-CM

## 2019-02-14 DIAGNOSIS — J309 Allergic rhinitis, unspecified: Secondary | ICD-10-CM | POA: Insufficient documentation

## 2019-02-14 DIAGNOSIS — J069 Acute upper respiratory infection, unspecified: Secondary | ICD-10-CM

## 2019-02-14 MED ORDER — MOMETASONE FURO-FORMOTEROL FUM 200-5 MCG/ACT IN AERO: 2.0000 | INHALATION_SPRAY | Freq: Two times a day (BID) | RESPIRATORY_TRACT | 5 refills | Status: DC

## 2019-02-14 NOTE — Assessment & Plan Note (Addendum)
Exacerbation due to URI.  Dulera inhaler Rx.  Use 2 puffs twice a day

## 2019-02-14 NOTE — Progress Notes (Signed)
Virtual Visit via Telephone Note  I connected with [name] on 02/14/19 at 11:20 AM EDT by telephone and verified that I am speaking with the correct person using two identifiers.   I discussed the limitations, risks, security and privacy concerns of performing an evaluation and management service by telephone and the availability of in person appointments. I also discussed with the patient that there may be a patient responsible charge related to this service. The patient expressed understanding and agreed to proceed.   History of Present Illness:   The patient is complaining of being weak and tired over past 3 days.  Her chest felt tight yesterday and she had to use her sample inhaler Ruthe Mannan) that helped she did cut her grass yesterday.  There is no fever, cough, sore throat. She is at home on isolation.  She did see friends on Saturday.  Her husband continues to work outside of the home. Observations/Objective:  The patient is in no acute distress.  No obvious difficulty breathing Assessment and Plan:  See plan Follow Up Instructions:    I discussed the assessment and treatment plan with the patient. The patient was provided an opportunity to ask questions and all were answered. The patient agreed with the plan and demonstrated an understanding of the instructions.   The patient was advised to call back or seek an in-person evaluation if the symptoms worsen or if the condition fails to improve as anticipated.  I provided 20 minutes of non-face-to-face time during this encounter.   Walker Kehr, MD

## 2019-02-14 NOTE — Assessment & Plan Note (Signed)
Probably viral syndrome.  COVID-19 prophylaxis/typical signs discussed. Use over-the-counter cold nighttime/daytime meds as needed.  Vitamin C with zinc.  Good hydration.  Rest. Treat asthma. Call if worse

## 2019-02-14 NOTE — Assessment & Plan Note (Signed)
Continue with Allegra daily

## 2019-02-14 NOTE — Progress Notes (Signed)
Based on what you shared with me, I feel your condition warrants further evaluation specially since you are also having chest tightness and I recommend that you be seen for a face to face office visit. Typical influenza symptoms are fever, lots of runny nose and cough, sore throat or scratchy throat and terrible body aches.      NOTE: If you entered your credit card information for this eVisit, you will not be charged. You may see a "hold" on your card for the $35 but that hold will drop off and you will not have a charge processed.  If you are having a true medical emergency please call 911.  If you need an urgent face to face visit, [facility] has four urgent care centers for your convenience.    PLEASE NOTE: THE [facility] LOCATIONS AND URGENT CARE CLINICS DO NOT HAVE THE TESTING FOR CORONAVIRUS COVID19 AVAILABLE.  IF YOU FEEL YOU NEED THIS TEST YOU MUST GO TO A TRIAGE LOCATION AT [facility]   [email or web address] to reserve your spot online an avoid wait times  [facility] [address] [place], [place] [address] Modified hours of operation: Monday-Friday, 10 AM to 6 PM  Saturday & Sunday 10 AM to 4 PM *Across the street from [place]New Address[place] [address], [place], [place] [address] *Just off [address], across the road from [place] hours of operation: Monday-Friday, 10 AM to 5 PM  Closed Saturday & Sunday   The following sites will take your insurance:  . [facility] [place] a Provider at this Location  [address] [place], [place] [address] . 10 am to 8 pm Monday-Friday . 12 pm to 8 pm Saturday-Sunday   . [facility] at [place] a Provider at this Location  [place] [place], [place], [place] [address] . 8  am to 8 pm Monday-Friday . 9 am to 6 pm Saturday . 11 am to 6 pm Sunday   . [facility] at [place] Get Driving Directions  [address] [place], [place] [address] . 8 am to 8 pm Monday-Friday . 8 am to 4 pm Saturday-Sunday   Your e-visit answers were reviewed by a board certified advanced clinical practitioner to complete your personal care plan.  Thank you for using e-Visits.

## 2019-05-11 DIAGNOSIS — Z1231 Encounter for screening mammogram for malignant neoplasm of breast: Secondary | ICD-10-CM | POA: Diagnosis not present

## 2019-05-11 DIAGNOSIS — Z130 Encounter for screening for diseases of the blood and blood-forming organs and certain disorders involving the immune mechanism: Secondary | ICD-10-CM | POA: Diagnosis not present

## 2019-05-11 DIAGNOSIS — Z01419 Encounter for gynecological examination (general) (routine) without abnormal findings: Secondary | ICD-10-CM | POA: Diagnosis not present

## 2019-08-16 ENCOUNTER — Other Ambulatory Visit: Payer: Self-pay

## 2019-08-16 ENCOUNTER — Encounter: Payer: Self-pay | Admitting: Family

## 2019-08-16 ENCOUNTER — Ambulatory Visit (INDEPENDENT_AMBULATORY_CARE_PROVIDER_SITE_OTHER): Payer: 59 | Admitting: Family

## 2019-08-16 ENCOUNTER — Other Ambulatory Visit (INDEPENDENT_AMBULATORY_CARE_PROVIDER_SITE_OTHER): Payer: 59

## 2019-08-16 VITALS — BP 128/80 | HR 86 | Temp 98.3°F | Ht 65.0 in | Wt 215.6 lb

## 2019-08-16 DIAGNOSIS — Z13220 Encounter for screening for lipoid disorders: Secondary | ICD-10-CM

## 2019-08-16 DIAGNOSIS — Z0000 Encounter for general adult medical examination without abnormal findings: Secondary | ICD-10-CM

## 2019-08-16 DIAGNOSIS — H9203 Otalgia, bilateral: Secondary | ICD-10-CM | POA: Diagnosis not present

## 2019-08-16 LAB — COMPREHENSIVE METABOLIC PANEL
ALT: 11 U/L (ref 0–35)
AST: 12 U/L (ref 0–37)
Albumin: 4.1 g/dL (ref 3.5–5.2)
Alkaline Phosphatase: 74 U/L (ref 39–117)
BUN: 10 mg/dL (ref 6–23)
CO2: 27 mEq/L (ref 19–32)
Calcium: 9.2 mg/dL (ref 8.4–10.5)
Chloride: 105 mEq/L (ref 96–112)
Creatinine, Ser: 0.75 mg/dL (ref 0.40–1.20)
GFR: 83.85 mL/min (ref >60.00)
Glucose, Bld: 96 mg/dL (ref 70–99)
Potassium: 3.9 mEq/L (ref 3.5–5.1)
Sodium: 139 mEq/L (ref 135–145)
Total Bilirubin: 0.4 mg/dL (ref 0.2–1.2)
Total Protein: 7.2 g/dL (ref 6.0–8.3)

## 2019-08-16 LAB — CBC WITH DIFFERENTIAL/PLATELET
Basophils Absolute: 0 10*3/uL (ref 0.0–0.1)
Basophils Relative: 0.7 % (ref 0.0–3.0)
Eosinophils Absolute: 0.1 10*3/uL (ref 0.0–0.7)
Eosinophils Relative: 2.1 % (ref 0.0–5.0)
HCT: 43.5 % (ref 36.0–46.0)
Hemoglobin: 14.6 g/dL (ref 12.0–15.0)
Lymphocytes Relative: 29.6 % (ref 12.0–46.0)
Lymphs Abs: 2.1 10*3/uL (ref 0.7–4.0)
MCHC: 33.6 g/dL (ref 30.0–36.0)
MCV: 97.4 fl (ref 78.0–100.0)
Monocytes Absolute: 0.5 10*3/uL (ref 0.1–1.0)
Monocytes Relative: 6.9 % (ref 3.0–12.0)
Neutro Abs: 4.2 10*3/uL (ref 1.4–7.7)
Neutrophils Relative %: 60.7 % (ref 43.0–77.0)
Platelets: 260 10*3/uL (ref 150.0–400.0)
RBC: 4.47 Mil/uL (ref 3.87–5.11)
RDW: 12.3 % (ref 11.5–15.5)
WBC: 6.9 10*3/uL (ref 4.0–10.5)

## 2019-08-16 LAB — LIPID PANEL
Cholesterol: 158 mg/dL (ref 0–200)
HDL: 43.1 mg/dL (ref >39.00)
LDL Cholesterol: 99 mg/dL (ref 0–99)
NonHDL: 115.21
Total CHOL/HDL Ratio: 4
Triglycerides: 80 mg/dL (ref 0.0–149.0)
VLDL: 16 mg/dL (ref 0.0–40.0)

## 2019-08-16 LAB — VITAMIN D 25 HYDROXY (VIT D DEFICIENCY, FRACTURES): VITD: 32.34 ng/mL (ref 30.00–100.00)

## 2019-08-16 LAB — TSH: TSH: 1.14 u[IU]/mL (ref 0.35–4.50)

## 2019-08-16 MED ORDER — AMOXICILLIN-POT CLAVULANATE 875-125 MG PO TABS: 1.0000 | ORAL_TABLET | Freq: Two times a day (BID) | ORAL | 0 refills | Status: DC

## 2019-08-16 MED ORDER — METHYLPREDNISOLONE 4 MG PO TBPK: ORAL_TABLET | ORAL | 0 refills | Status: DC

## 2019-08-16 NOTE — Progress Notes (Signed)
[name] is a 43 y.o. female with the following history as recorded in EpicCare:  Patient Active Problem List   Diagnosis Date Noted  . Allergic rhinitis 02/14/2019  . Migraine headache 02/17/2018  . S/P vaginal hysterectomy 03/19/2017  . Upper respiratory tract infection 09/29/2016  . Costochondritis 09/09/2016  . EUSTACHIAN TUBE DYSFUNCTION, RIGHT 07/04/2009  . KNEE PAIN 08/23/2007  . Asthma 01/11/2007    Current Outpatient Medications  Medication Sig Dispense Refill  . calcium carbonate (TUMS - DOSED IN MG ELEMENTAL CALCIUM) 500 MG chewable tablet Chew 1 tablet by mouth daily.    . clobetasol ointment (TEMOVATE) 0.05 % clobetasol 0.05 % topical ointment  Apply to affected areas bid prn    . diclofenac (VOLTAREN) 75 MG EC tablet diclofenac sodium 75 mg tablet,delayed release    . fexofenadine (ALLEGRA) 180 MG tablet Take 180 mg by mouth daily.    . mometasone-formoterol (DULERA) 200-5 MCG/ACT AERO Inhale 2 puffs into the lungs 2 (two) times daily. 1 Inhaler 5  . pantoprazole (PROTONIX) 40 MG tablet Take 1 tablet (40 mg total) by mouth daily. 60 tablet 2  . amoxicillin-clavulanate (AUGMENTIN) 875-125 MG tablet Take 1 tablet by mouth 2 (two) times daily. 20 tablet 0  . methylPREDNISolone (MEDROL DOSEPAK) 4 MG TBPK tablet Taper as directed 21 tablet 0   No current facility-administered medications for this visit.     Allergies: Sulfa antibiotics  Past Medical History:  Diagnosis Date  . Abnormal uterine bleeding (AUB)   . Eczema   . GERD (gastroesophageal reflux disease)   . History of exercise stress test    09-17-2016--- NORMAL  (NO ISCHEMIA AND NO EVIDENCE HEART ARTERY DISEASE)  . Seasonal allergies     Past Surgical History:  Procedure Laterality Date  . BILATERAL SALPINGECTOMY Bilateral 03/19/2017   Procedure: BILATERAL SALPINGECTOMY;  Surgeon: Cheri Fowler, MD;  Location: [facility];  Service: Gynecology;  Laterality: Bilateral;  . CERVICAL BIOPSY   W/ LOOP ELECTRODE EXCISION  yrs ago  . CYSTOSCOPY N/A 03/19/2017   Procedure: CYSTOSCOPY;  Surgeon: Cheri Fowler, MD;  Location: [facility];  Service: Gynecology;  Laterality: N/A;  . D & C HYSTEROSCOPY / POLYPECTOMY/  ENDOMETRIAL ABLATION  05/27/2016  . DX LAPAROSCOPY  01/26/2003  . HIP ARTHROSCOPY W/ LABRAL DEBRIDEMENT Left 06/14/2003   and Chondroplasty  . KNEE ARTHROSCOPY W/ ACL RECONSTRUCTION Bilateral left 1992/  right 2016  . KNEE ARTHROSCOPY W/ MEDIAL COLLATERAL LIGAMENT (MCL) REPAIR Right 2017  . LAPAROSCOPIC TUBAL LIGATION Bilateral 11/11/2005  . VAGINAL HYSTERECTOMY N/A 03/19/2017   Procedure: HYSTERECTOMY VAGINAL;  Surgeon: Cheri Fowler, MD;  Location: [facility];  Service: Gynecology;  Laterality: N/A;  pt will be OIB    Family History  Problem Relation Age of Onset  . Diabetes Maternal Grandmother   . Heart disease Maternal Grandfather   . Stroke Maternal Grandfather   . Cancer Maternal Grandfather   . Diabetes Maternal Grandfather   . Hypertension Maternal Grandfather   . Heart disease Paternal Grandmother   . Hypertension Paternal Grandmother   . Cancer Mother 24       urethra cancer with metastasis    Social History   Tobacco Use  . Smoking status: Former Smoker    Years: 15.00    Types: Cigarettes    Quit date: 2006    Years since quitting: 14.8  . Smokeless tobacco: Never Used  Substance Use Topics  . Alcohol use: Yes  Alcohol/week: 4.0 standard drinks    Types: 4 Standard drinks or equivalent per week    Comment: occasional    Subjective:  Patient presents for yearly CPE; GYN has done pap smear/ mammogram earlier this year; Up to date on dental and vision exams; Has been having increased ear pain x 6 months; does not feel that hearing is down;  Review of Systems  Constitutional: Negative.   HENT: Positive for ear pain. Negative for ear discharge and sinus pain.   Eyes: Negative.   Respiratory: Negative for  cough and shortness of breath.   Cardiovascular: Negative for chest pain.  Gastrointestinal: Negative for abdominal pain.  Genitourinary: Negative.   Musculoskeletal: Negative.   Skin: Negative.   Neurological: Negative.   Endo/Heme/Allergies: Negative.   Psychiatric/Behavioral: Negative.        Objective:  Vitals:   08/16/19 0904  BP: 128/80  Pulse: 86  Temp: 98.3 F (36.8 C)  TempSrc: Oral  SpO2: 96%  Weight: 215 lb 9.6 oz (97.8 kg)  Height: '5\' 5"'  (1.651 m)    General: Well developed, well nourished, in no acute distress  Skin : Warm and dry.  Head: Normocephalic and atraumatic  Eyes: Sclera and conjunctiva clear; pupils round and reactive to light; extraocular movements intact  Ears: External normal; canals clear; tympanic membranes normal  Oropharynx: Pink, supple. No suspicious lesions  Neck: Supple without thyromegaly, adenopathy  Lungs: Respirations unlabored; clear to auscultation bilaterally without wheeze, rales, rhonchi  CVS exam: normal rate and regular rhythm.  Abdomen: Soft; nontender; nondistended; normoactive bowel sounds; no masses or hepatosplenomegaly  Musculoskeletal: No deformities; no active joint inflammation  Extremities: No edema, cyanosis, clubbing  Vessels: Symmetric bilaterally  Neurologic: Alert and oriented; speech intact; face symmetrical; moves all extremities well; CNII-XII intact without focal deficit   Assessment:  1. PE (physical exam), annual   2. Ear pain, bilateral   3. Lipid screening     Plan:  Age appropriate preventive healthcare needs addressed; encouraged regular eye doctor and dental exams; encouraged regular exercise; will update labs and refills as needed today; follow-up to be determined; Refer to ENT- suspect eustachian tube dysfunction; Rx for Medrol Dose pak and Augmentin;  Follow-up to be determined.   No follow-ups on file.  Orders Placed This Encounter  Procedures  . CBC w/Diff    Standing Status:   Future     Standing Expiration Date:   08/15/2020  . Comp Met (CMET)    Standing Status:   Future    Standing Expiration Date:   08/15/2020  . Lipid panel    Standing Status:   Future    Standing Expiration Date:   08/15/2020  . TSH    Standing Status:   Future    Standing Expiration Date:   08/15/2020  . Vitamin D (25 hydroxy)    Standing Status:   Future    Standing Expiration Date:   08/15/2020  . Ambulatory referral to ENT    Referral Priority:   Routine    Referral Type:   Consultation    Referral Reason:   Specialty Services Required    Requested Specialty:   Otolaryngology    Number of Visits Requested:   1    Requested Prescriptions   Signed Prescriptions Disp Refills  . methylPREDNISolone (MEDROL DOSEPAK) 4 MG TBPK tablet 21 tablet 0    Sig: Taper as directed  . amoxicillin-clavulanate (AUGMENTIN) 875-125 MG tablet 20 tablet 0    Sig: Take 1 tablet  by mouth 2 (two) times daily.

## 2019-08-22 ENCOUNTER — Encounter: Payer: Self-pay | Admitting: Family

## 2019-09-06 DIAGNOSIS — M26621 Arthralgia of right temporomandibular joint: Secondary | ICD-10-CM | POA: Insufficient documentation

## 2019-09-06 DIAGNOSIS — H9203 Otalgia, bilateral: Secondary | ICD-10-CM | POA: Insufficient documentation

## 2020-01-10 DIAGNOSIS — M25512 Pain in left shoulder: Secondary | ICD-10-CM | POA: Insufficient documentation

## 2020-05-23 DIAGNOSIS — M542 Cervicalgia: Secondary | ICD-10-CM | POA: Insufficient documentation

## 2020-06-06 ENCOUNTER — Other Ambulatory Visit: Payer: Self-pay | Admitting: Obstetrics and Gynecology

## 2020-06-06 DIAGNOSIS — R928 Other abnormal and inconclusive findings on diagnostic imaging of breast: Secondary | ICD-10-CM

## 2020-06-20 ENCOUNTER — Other Ambulatory Visit: Payer: Self-pay

## 2020-06-20 ENCOUNTER — Ambulatory Visit
Admission: RE | Admit: 2020-06-20 | Discharge: 2020-06-20 | Disposition: A | Discharge status: Home / Self Care | Payer: 59 | Source: Ambulatory Visit | Attending: Obstetrics and Gynecology | Admitting: Obstetrics and Gynecology

## 2020-06-20 ENCOUNTER — Other Ambulatory Visit: Payer: Self-pay | Admitting: Obstetrics and Gynecology

## 2020-06-20 DIAGNOSIS — R928 Other abnormal and inconclusive findings on diagnostic imaging of breast: Secondary | ICD-10-CM

## 2020-06-20 DIAGNOSIS — N6320 Unspecified lump in the left breast, unspecified quadrant: Secondary | ICD-10-CM

## 2020-06-29 ENCOUNTER — Ambulatory Visit
Admission: RE | Admit: 2020-06-29 | Discharge: 2020-06-29 | Disposition: A | Discharge status: Home / Self Care | Payer: 59 | Source: Ambulatory Visit | Attending: Obstetrics and Gynecology | Admitting: Obstetrics and Gynecology

## 2020-06-29 ENCOUNTER — Other Ambulatory Visit: Payer: Self-pay

## 2020-06-29 DIAGNOSIS — N6320 Unspecified lump in the left breast, unspecified quadrant: Secondary | ICD-10-CM

## 2020-07-03 ENCOUNTER — Other Ambulatory Visit: Payer: Self-pay | Admitting: Obstetrics and Gynecology

## 2020-07-03 DIAGNOSIS — R928 Other abnormal and inconclusive findings on diagnostic imaging of breast: Secondary | ICD-10-CM

## 2020-07-05 ENCOUNTER — Telehealth: Payer: 59 | Admitting: Physician Assistant

## 2020-07-05 DIAGNOSIS — R059 Cough, unspecified: Secondary | ICD-10-CM

## 2020-07-05 NOTE — Progress Notes (Signed)
Based on what you shared with me, I feel your condition warrants further evaluation and I recommend that you be seen for a face to face office visit.  You may contact your PCP or you may proceed to urgent care or ER.  Your symptoms are consistent with COVID-19 and we strongly recommended getting testing, which can be done at [facility], locations are  listed below. I recommend that you pursue an in-office visit so more information can be obtained, as well as vitals and a physical exam can be performed, if necessary. This will help determine the best care for you, to determine if you need an oral antibiotic, steroids, inhalers, or other types treatment. Additionally, if you are positive for COVID-19 you may be eligible for monoclonal antibody treatment.   NOTE: If you entered your credit card information for this eVisit, you will not be charged. You may see a "hold" on your card for the $35 but that hold will drop off and you will not have a charge processed.   If you are having a true medical emergency please call 911.      For an urgent face to face visit, [facility] has five urgent care centers for your convenience:      NEW:  [facility] [place] [facility] [place] Get Driving Directions [phone] [place], [place] [address] . 10 am - 6pm Monday - Friday    [facility]) Get Driving Directions [phone] [address] [place], [place] [address] . 10 am to 8 pm Monday-Friday . 12 pm to 8 pm [facility] at [facility] Get Driving Directions [phone] [place], [place], [place] [address] . 8 am to 8 pm Monday-Friday . 9 am to 6 pm Saturday . 11 am to 6 pm Sunday     [facility] at [facility] Get Driving Directions  [phone] [address] [place], [place] [address] . 8 am to 8 pm Monday-Friday . 8  am to 4 pm [facility] at [place] Get Driving Directions [phone] [place] [place], [place] [address] . 12 pm to 6 pm Monday-Friday      Your e-visit answers were reviewed by a board certified advanced clinical practitioner to complete your personal care plan.  Thank you for using e-Visits.    Greater than 5 minutes, yet less than 10 minutes of time have been spent researching, coordinating, and implementing care for this patient today.  Inda Coke PA-C

## 2020-07-09 ENCOUNTER — Ambulatory Visit
Admission: RE | Admit: 2020-07-09 | Discharge: 2020-07-09 | Disposition: A | Discharge status: Home / Self Care | Payer: 59 | Source: Ambulatory Visit | Attending: Obstetrics and Gynecology | Admitting: Obstetrics and Gynecology

## 2020-07-09 ENCOUNTER — Other Ambulatory Visit: Payer: Self-pay

## 2020-07-09 DIAGNOSIS — R928 Other abnormal and inconclusive findings on diagnostic imaging of breast: Secondary | ICD-10-CM

## 2020-12-01 ENCOUNTER — Encounter: Payer: Self-pay | Admitting: Family

## 2021-04-18 ENCOUNTER — Encounter: Payer: Self-pay | Admitting: Family

## 2021-04-18 ENCOUNTER — Ambulatory Visit (INDEPENDENT_AMBULATORY_CARE_PROVIDER_SITE_OTHER): Payer: 59 | Admitting: Family

## 2021-04-18 ENCOUNTER — Other Ambulatory Visit: Payer: Self-pay

## 2021-04-18 VITALS — BP 140/100 | HR 88 | Temp 98.4°F | Ht 65.0 in | Wt 199.2 lb

## 2021-04-18 DIAGNOSIS — R109 Unspecified abdominal pain: Secondary | ICD-10-CM | POA: Diagnosis not present

## 2021-04-18 DIAGNOSIS — Z0000 Encounter for general adult medical examination without abnormal findings: Secondary | ICD-10-CM | POA: Diagnosis not present

## 2021-04-18 DIAGNOSIS — M26621 Arthralgia of right temporomandibular joint: Secondary | ICD-10-CM

## 2021-04-18 DIAGNOSIS — Z13220 Encounter for screening for lipoid disorders: Secondary | ICD-10-CM | POA: Diagnosis not present

## 2021-04-18 DIAGNOSIS — R030 Elevated blood-pressure reading, without diagnosis of hypertension: Secondary | ICD-10-CM

## 2021-04-18 LAB — COMPREHENSIVE METABOLIC PANEL
ALT: 9 U/L (ref 0–35)
AST: 9 U/L (ref 0–37)
Albumin: 4.3 g/dL (ref 3.5–5.2)
Alkaline Phosphatase: 72 U/L (ref 39–117)
BUN: 14 mg/dL (ref 6–23)
CO2: 30 mEq/L (ref 19–32)
Calcium: 9.6 mg/dL (ref 8.4–10.5)
Chloride: 104 mEq/L (ref 96–112)
Creatinine, Ser: 0.81 mg/dL (ref 0.40–1.20)
GFR: 87.36 mL/min (ref >60.00)
Glucose, Bld: 84 mg/dL (ref 70–99)
Potassium: 4.5 mEq/L (ref 3.5–5.1)
Sodium: 140 mEq/L (ref 135–145)
Total Bilirubin: 0.4 mg/dL (ref 0.2–1.2)
Total Protein: 7 g/dL (ref 6.0–8.3)

## 2021-04-18 LAB — CBC WITH DIFFERENTIAL/PLATELET
Basophils Absolute: 0 10*3/uL (ref 0.0–0.1)
Basophils Relative: 0.4 % (ref 0.0–3.0)
Eosinophils Absolute: 0.1 10*3/uL (ref 0.0–0.7)
Eosinophils Relative: 2 % (ref 0.0–5.0)
HCT: 43.2 % (ref 36.0–46.0)
Hemoglobin: 14.6 g/dL (ref 12.0–15.0)
Lymphocytes Relative: 31.6 % (ref 12.0–46.0)
Lymphs Abs: 2 10*3/uL (ref 0.7–4.0)
MCHC: 33.9 g/dL (ref 30.0–36.0)
MCV: 97.7 fl (ref 78.0–100.0)
Monocytes Absolute: 0.4 10*3/uL (ref 0.1–1.0)
Monocytes Relative: 6.8 % (ref 3.0–12.0)
Neutro Abs: 3.8 10*3/uL (ref 1.4–7.7)
Neutrophils Relative %: 59.2 % (ref 43.0–77.0)
Platelets: 224 10*3/uL (ref 150.0–400.0)
RBC: 4.42 Mil/uL (ref 3.87–5.11)
RDW: 12.6 % (ref 11.5–15.5)
WBC: 6.4 10*3/uL (ref 4.0–10.5)

## 2021-04-18 LAB — LIPID PANEL
Cholesterol: 160 mg/dL (ref 0–200)
HDL: 47.1 mg/dL (ref >39.00)
LDL Cholesterol: 94 mg/dL (ref 0–99)
NonHDL: 112.75
Total CHOL/HDL Ratio: 3
Triglycerides: 93 mg/dL (ref 0.0–149.0)
VLDL: 18.6 mg/dL (ref 0.0–40.0)

## 2021-04-18 LAB — TSH: TSH: 1.66 u[IU]/mL (ref 0.35–4.50)

## 2021-04-18 NOTE — Patient Instructions (Signed)
Please see your dentist about the filling/ pain in the right side of the mouth;  Please purchase OMRON home blood pressure cuff and start checking your blood pressure regularly; if your numbers are consistently above 140/90, please schedule a follow up to discuss.

## 2021-04-18 NOTE — Progress Notes (Signed)
[name] is a 45 y.o. female with the following history as recorded in EpicCare:  Patient Active Problem List   Diagnosis Date Noted   Allergic rhinitis 02/14/2019   Migraine headache 02/17/2018   S/P vaginal hysterectomy 03/19/2017   Upper respiratory tract infection 09/29/2016   Costochondritis 09/09/2016   EUSTACHIAN TUBE DYSFUNCTION, RIGHT 07/04/2009   KNEE PAIN 08/23/2007   Asthma 01/11/2007    Current Outpatient Medications  Medication Sig Dispense Refill   clobetasol ointment (TEMOVATE) 0.05 % clobetasol 0.05 % topical ointment  Apply to affected areas bid prn     fexofenadine (ALLEGRA) 180 MG tablet Take 180 mg by mouth daily.     No current facility-administered medications for this visit.    Allergies: Sulfa antibiotics  Past Medical History:  Diagnosis Date   Abnormal uterine bleeding (AUB)    Eczema    GERD (gastroesophageal reflux disease)    History of exercise stress test    09-17-2016--- NORMAL  (NO ISCHEMIA AND NO EVIDENCE HEART ARTERY DISEASE)   Seasonal allergies     Past Surgical History:  Procedure Laterality Date   BILATERAL SALPINGECTOMY Bilateral 03/19/2017   Procedure: BILATERAL SALPINGECTOMY;  Surgeon: Cheri Fowler, MD;  Location: [facility];  Service: Gynecology;  Laterality: Bilateral;   CERVICAL BIOPSY  W/ LOOP ELECTRODE EXCISION  yrs ago   CYSTOSCOPY N/A 03/19/2017   Procedure: CYSTOSCOPY;  Surgeon: Cheri Fowler, MD;  Location: [facility];  Service: Gynecology;  Laterality: N/A;   D & C HYSTEROSCOPY / POLYPECTOMY/  ENDOMETRIAL ABLATION  05/27/2016   DX LAPAROSCOPY  01/26/2003   HIP ARTHROSCOPY W/ LABRAL DEBRIDEMENT Left 06/14/2003   and Chondroplasty   KNEE ARTHROSCOPY W/ ACL RECONSTRUCTION Bilateral left 1992/  right 2016   KNEE ARTHROSCOPY W/ MEDIAL COLLATERAL LIGAMENT (MCL) REPAIR Right 2017   LAPAROSCOPIC TUBAL LIGATION Bilateral 11/11/2005   VAGINAL HYSTERECTOMY N/A 03/19/2017   Procedure:  HYSTERECTOMY VAGINAL;  Surgeon: Cheri Fowler, MD;  Location: [facility];  Service: Gynecology;  Laterality: N/A;  pt will be OIB    Family History  Problem Relation Age of Onset   Diabetes Maternal Grandmother    Breast cancer Maternal Grandmother    Heart disease Maternal Grandfather    Stroke Maternal Grandfather    Cancer Maternal Grandfather    Diabetes Maternal Grandfather    Hypertension Maternal Grandfather    Heart disease Paternal Grandmother    Hypertension Paternal Grandmother    Cancer Mother 14       urethra cancer with metastasis    Social History   Tobacco Use   Smoking status: Former    Years: 15.00    Pack years: 0.00    Types: Cigarettes    Quit date: 2006    Years since quitting: 16.4   Smokeless tobacco: Never  Substance Use Topics   Alcohol use: Yes    Alcohol/week: 4.0 standard drinks    Types: 4 Standard drinks or equivalent per week    Comment: occasional    Subjective:  Presents for yearly CPE; up to date with GYN needs; having increased problems with TMJ and is scheduled to see her dentist soon- feels this is why her blood pressure is elevated;  Has been working on healthy lifestyle changes- down 15 pounds since last OV;   Review of Systems  Constitutional: Negative.   HENT: Negative.         TMJ pain  Eyes: Negative.   Respiratory: Negative.  Cardiovascular: Negative.   Gastrointestinal: Negative.   Genitourinary: Negative.   Musculoskeletal: Negative.   Skin: Negative.   Neurological: Negative.   Endo/Heme/Allergies: Negative.   Psychiatric/Behavioral: Negative.       Objective:  Vitals:   04/18/21 0817 04/18/21 0856  BP: (!) 146/100 (!) 140/100  Pulse: 88   Temp: 98.4 F (36.9 C)   TempSrc: Oral   SpO2: 98%   Weight: 199 lb 3.2 oz (90.4 kg)   Height: '5\' 5"'  (1.651 m)     General: Well developed, well nourished, in no acute distress  Skin : Warm and dry.  Head: Normocephalic and atraumatic  Eyes:  Sclera and conjunctiva clear; pupils round and reactive to light; extraocular movements intact  Ears: External normal; canals clear; tympanic membranes normal  Oropharynx: Pink, supple. No suspicious lesions  Neck: Supple without thyromegaly, adenopathy  Lungs: Respirations unlabored; clear to auscultation bilaterally without wheeze, rales, rhonchi  CVS exam: normal rate and regular rhythm.  Abdomen: Soft; nontender; nondistended; normoactive bowel sounds; no masses or hepatosplenomegaly  Musculoskeletal: No deformities; no active joint inflammation  Extremities: No edema, cyanosis, clubbing  Vessels: Symmetric bilaterally  Neurologic: Alert and oriented; speech intact; face symmetrical; moves all extremities well; CNII-XII intact without focal deficit   Assessment:  1. PE (physical exam), annual   2. Lipid screening   3. Arthralgia of right temporomandibular joint   4. Abdominal pain, unspecified abdominal location   5. Elevated blood pressure reading     Plan:  Age appropriate preventive healthcare needs addressed; encouraged regular eye doctor and dental exams; encouraged regular exercise; will update labs and refills as needed today; follow-up to be determined; she defers colon cancer screen at this time.  Encouraged to see her dentist to discuss treatment options for TMJ- orthodontist vs oral surgeon;  Update ultrasound to evaluate gallbladder; Patient feels blood pressure elevated due to mouth pain today- encouraged to start checking blood pressure regularly and follow up if consistently above 140/ 90.  This visit occurred during the SARS-CoV-2 public health emergency.  Safety protocols were in place, including screening questions prior to the visit, additional usage of staff PPE, and extensive cleaning of exam room while observing appropriate contact time as indicated for disinfecting solutions.     No follow-ups on file.  Orders Placed This Encounter  Procedures   US Abdomen  Complete    Standing Status:   Future    Standing Expiration Date:   04/18/2022    Order Specific Question:   Reason for Exam (SYMPTOM  OR DIAGNOSIS REQUIRED)    Answer:   abdominal pain    Order Specific Question:   Preferred imaging location?    Answer:   [facility]   CBC with Differential/Platelet   Comp Met (CMET)   Lipid panel   TSH    Requested Prescriptions    No prescriptions requested or ordered in this encounter

## 2021-05-14 ENCOUNTER — Ambulatory Visit
Admission: RE | Admit: 2021-05-14 | Discharge: 2021-05-14 | Disposition: A | Discharge status: Home / Self Care | Payer: 59 | Source: Ambulatory Visit | Attending: Family | Admitting: Family

## 2021-05-14 ENCOUNTER — Other Ambulatory Visit: Payer: Self-pay | Admitting: Family

## 2021-05-14 DIAGNOSIS — K824 Cholesterolosis of gallbladder: Secondary | ICD-10-CM

## 2021-05-14 DIAGNOSIS — R109 Unspecified abdominal pain: Secondary | ICD-10-CM

## 2021-05-14 DIAGNOSIS — K828 Other specified diseases of gallbladder: Secondary | ICD-10-CM

## 2021-05-21 ENCOUNTER — Encounter: Payer: Self-pay | Admitting: Family

## 2021-07-29 IMAGING — MG MM BREAST LOCALIZATION CLIP
4 series · 4 of 12 positions shown · non-contrast
Comparison: Previous exam(s).

CLINICAL DATA: Confirmation of clip placement stereotactic
tomosynthesis core needle biopsy of a focal asymmetry involving the
UPPER LEFT breast at POSTERIOR depth.

EXAM:
2D AND TOMOSYNTHESIS DIAGNOSTIC LEFT MAMMOGRAM POST STEREOTACTIC
BIOPSY

[L ML synth-2D]
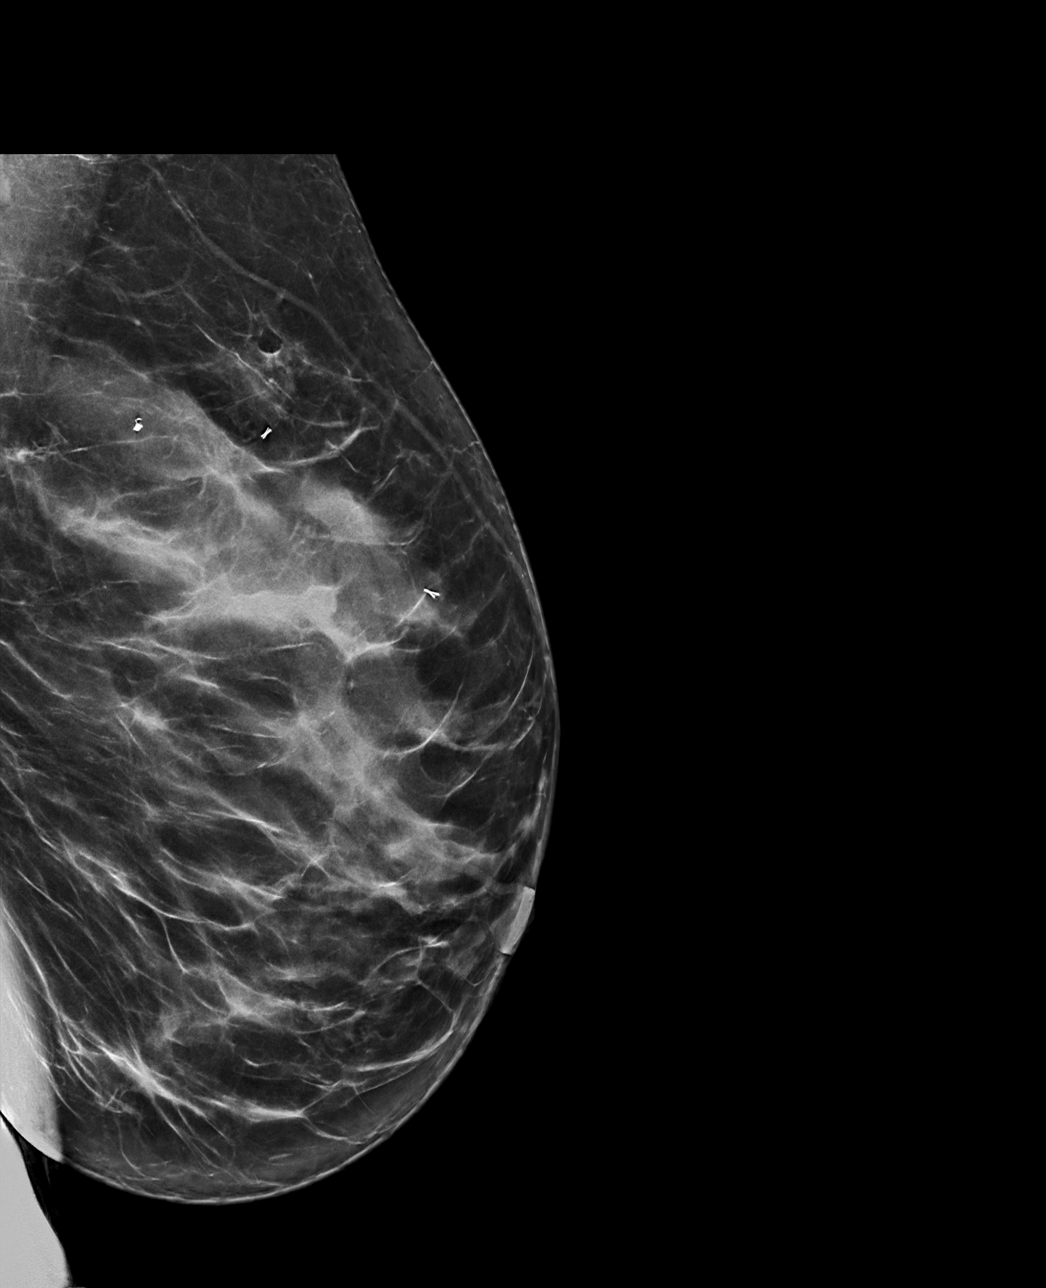

[L CC synth-2D]
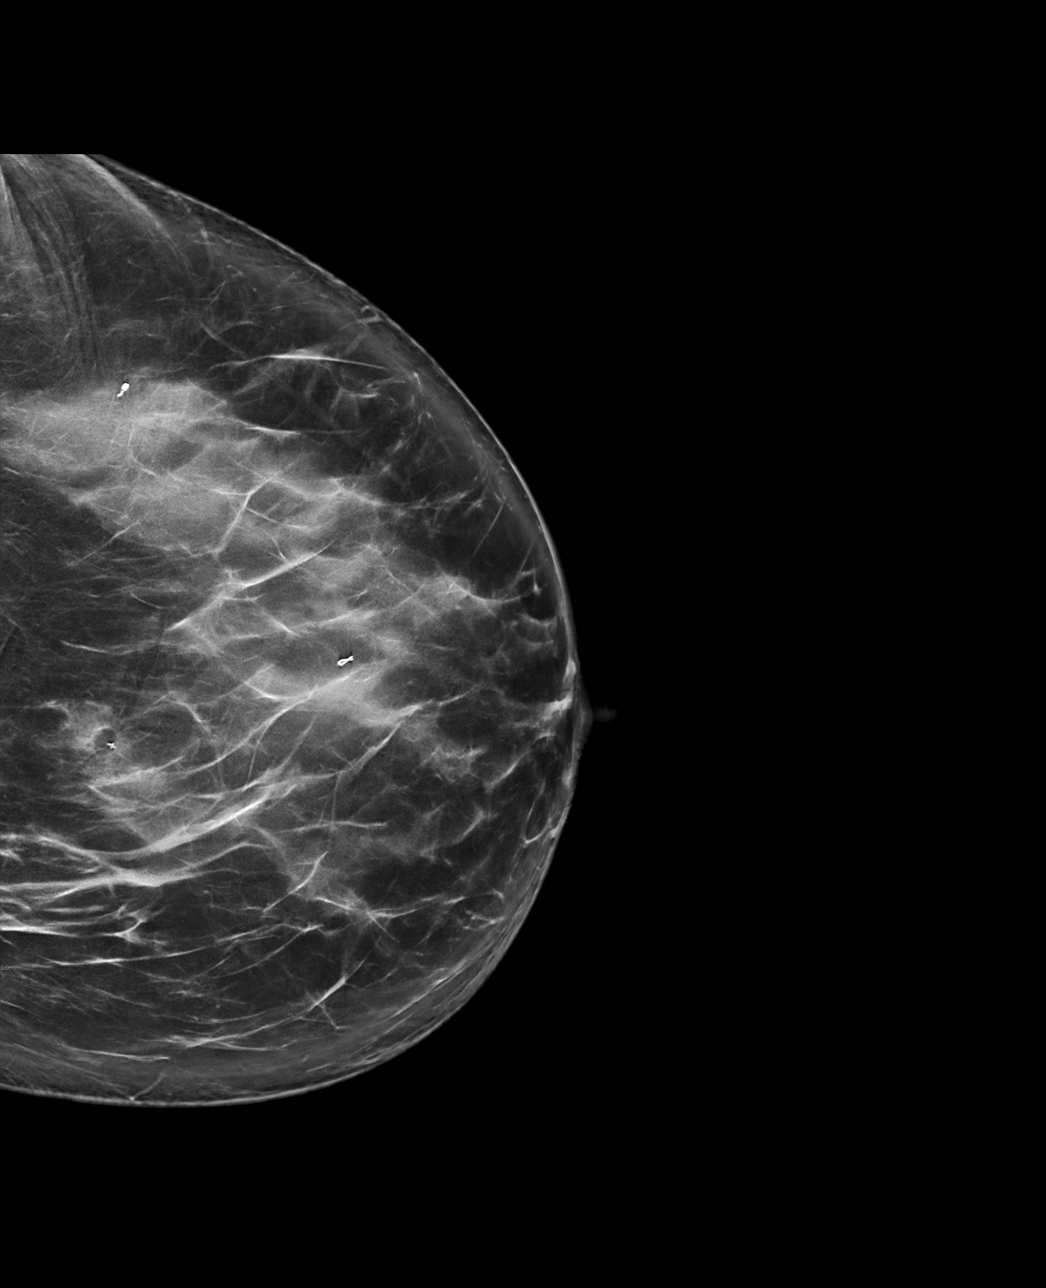

[L ML tomo · tomo slice 39/77.0]
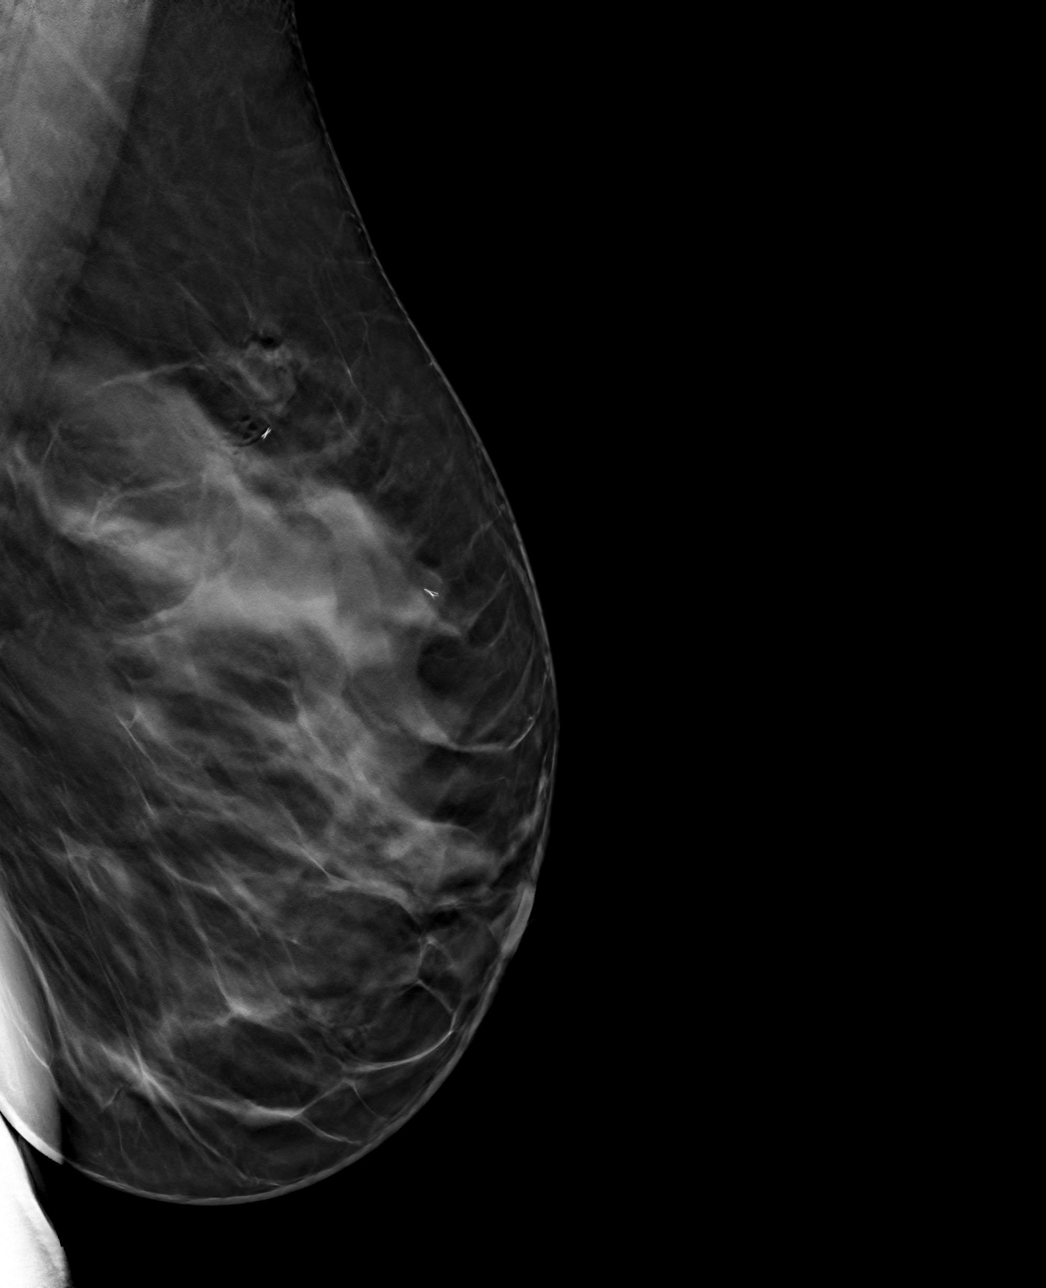

[L CC tomo · tomo slice 45/90.0]
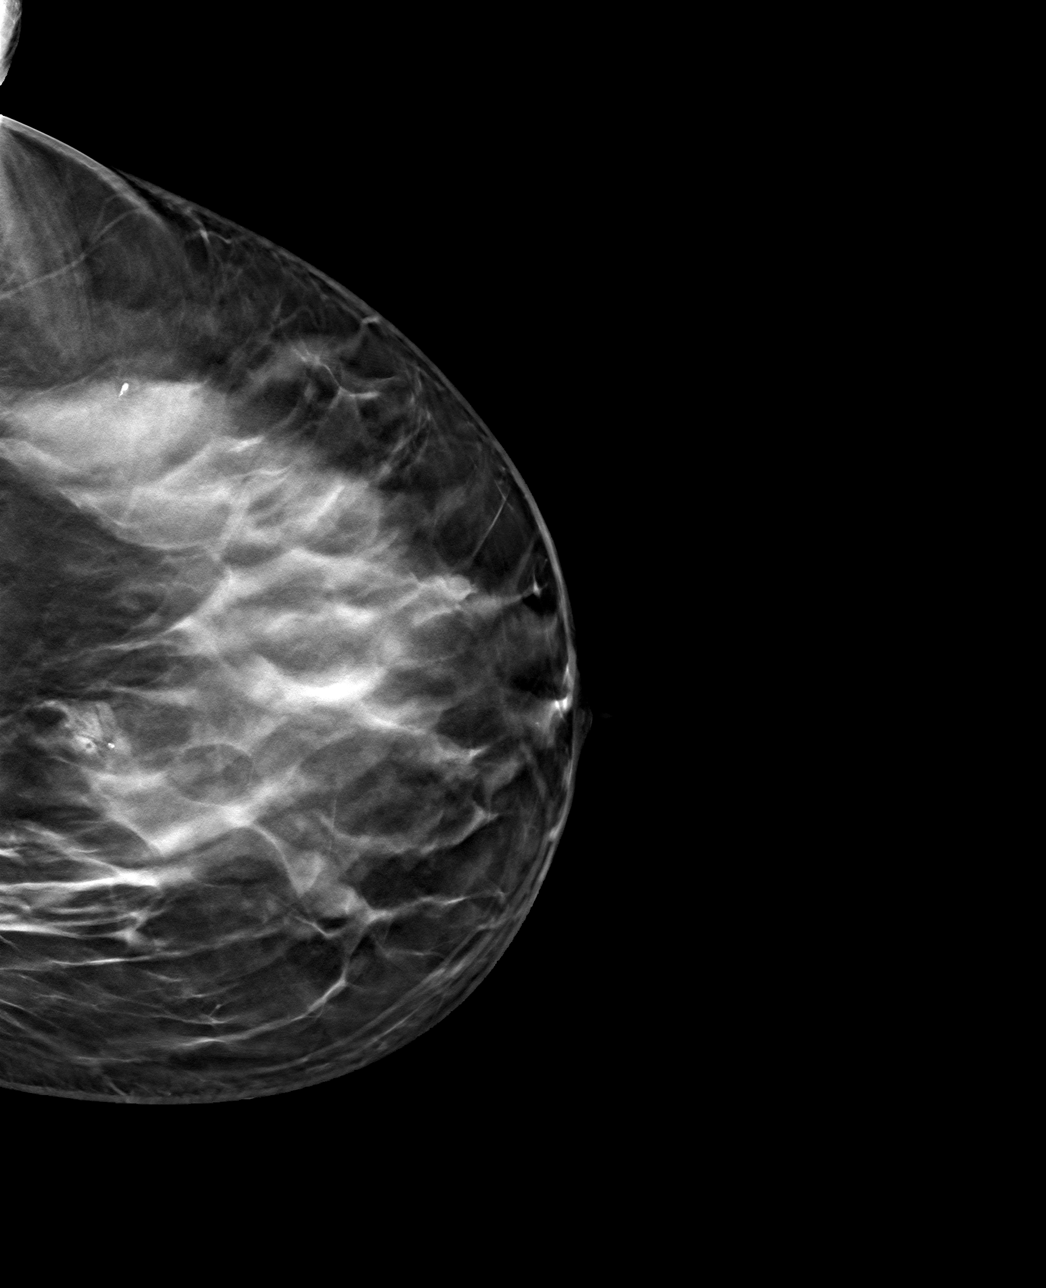

[4 of 12 positions shown; findings below may reference images not displayed]

FINDINGS: Tomosynthesis and synthesized full field CC and mediolateral images
were obtained following stereotactic tomosynthesis guided biopsy of
a focal asymmetry involving the UPPER LEFT breast at POSTERIOR
depth. The X shaped tissue marking clip is appropriately positioned
at the anterior-inferior margin of the biopsied focal asymmetry in
the slight UPPER INNER QUADRANT POSTERIOR depth. (The asymmetry
moves superiorly on the mediolateral images relative to its position
on MLO images as it is the INNER breast).

Expected post biopsy changes are present without evidence of
hematoma.
IMPRESSION: Appropriate positioning of the X shaped biopsy marking clip at the
site of the biopsied focal asymmetry in the slight UPPER INNER
QUADRANT the LEFT breast at POSTERIOR depth.

Final Assessment: Post Procedure Mammograms for Marker Placement

## 2021-08-02 ENCOUNTER — Other Ambulatory Visit: Payer: Self-pay | Admitting: General Surgery

## 2022-05-26 LAB — HM PAP SMEAR: HM Pap smear: NORMAL

## 2022-06-02 ENCOUNTER — Other Ambulatory Visit: Payer: Self-pay | Admitting: Obstetrics and Gynecology

## 2022-06-02 DIAGNOSIS — R928 Other abnormal and inconclusive findings on diagnostic imaging of breast: Secondary | ICD-10-CM

## 2022-06-10 ENCOUNTER — Ambulatory Visit
Admission: RE | Admit: 2022-06-10 | Discharge: 2022-06-10 | Disposition: A | Discharge status: Home / Self Care | Payer: 59 | Source: Ambulatory Visit | Attending: Obstetrics and Gynecology | Admitting: Obstetrics and Gynecology

## 2022-06-10 DIAGNOSIS — R928 Other abnormal and inconclusive findings on diagnostic imaging of breast: Secondary | ICD-10-CM

## 2022-08-05 DIAGNOSIS — M25561 Pain in right knee: Secondary | ICD-10-CM | POA: Insufficient documentation

## 2022-08-26 DIAGNOSIS — M170 Bilateral primary osteoarthritis of knee: Secondary | ICD-10-CM | POA: Insufficient documentation

## 2023-01-06 DIAGNOSIS — M7670 Peroneal tendinitis, unspecified leg: Secondary | ICD-10-CM | POA: Insufficient documentation

## 2023-01-23 DIAGNOSIS — M79671 Pain in right foot: Secondary | ICD-10-CM | POA: Insufficient documentation

## 2023-02-20 ENCOUNTER — Ambulatory Visit (INDEPENDENT_AMBULATORY_CARE_PROVIDER_SITE_OTHER): Payer: 59 | Admitting: Family

## 2023-02-20 ENCOUNTER — Encounter: Payer: Self-pay | Admitting: Family

## 2023-02-20 VITALS — BP 116/64 | HR 75 | Resp 18 | Ht 65.0 in | Wt 217.8 lb

## 2023-02-20 DIAGNOSIS — R202 Paresthesia of skin: Secondary | ICD-10-CM | POA: Diagnosis not present

## 2023-02-20 DIAGNOSIS — S0990XS Unspecified injury of head, sequela: Secondary | ICD-10-CM | POA: Diagnosis not present

## 2023-02-20 DIAGNOSIS — Z13220 Encounter for screening for lipoid disorders: Secondary | ICD-10-CM

## 2023-02-20 DIAGNOSIS — Z1211 Encounter for screening for malignant neoplasm of colon: Secondary | ICD-10-CM | POA: Diagnosis not present

## 2023-02-20 DIAGNOSIS — R200 Anesthesia of skin: Secondary | ICD-10-CM | POA: Diagnosis not present

## 2023-02-20 DIAGNOSIS — Z0000 Encounter for general adult medical examination without abnormal findings: Secondary | ICD-10-CM

## 2023-02-20 LAB — CBC WITH DIFFERENTIAL/PLATELET
Basophils Absolute: 0 10*3/uL (ref 0.0–0.1)
Basophils Relative: 0.3 % (ref 0.0–3.0)
Eosinophils Absolute: 0.2 10*3/uL (ref 0.0–0.7)
Eosinophils Relative: 2 % (ref 0.0–5.0)
HCT: 44.6 % (ref 36.0–46.0)
Hemoglobin: 15.1 g/dL — ABNORMAL HIGH (ref 12.0–15.0)
Lymphocytes Relative: 24.4 % (ref 12.0–46.0)
Lymphs Abs: 2.3 10*3/uL (ref 0.7–4.0)
MCHC: 34 g/dL (ref 30.0–36.0)
MCV: 97.6 fl (ref 78.0–100.0)
Monocytes Absolute: 0.6 10*3/uL (ref 0.1–1.0)
Monocytes Relative: 6.7 % (ref 3.0–12.0)
Neutro Abs: 6.3 10*3/uL (ref 1.4–7.7)
Neutrophils Relative %: 66.6 % (ref 43.0–77.0)
Platelets: 280 10*3/uL (ref 150.0–400.0)
RBC: 4.57 Mil/uL (ref 3.87–5.11)
RDW: 12.8 % (ref 11.5–15.5)
WBC: 9.4 10*3/uL (ref 4.0–10.5)

## 2023-02-20 LAB — COMPREHENSIVE METABOLIC PANEL
ALT: 13 U/L (ref 0–35)
AST: 11 U/L (ref 0–37)
Albumin: 4.4 g/dL (ref 3.5–5.2)
Alkaline Phosphatase: 77 U/L (ref 39–117)
BUN: 10 mg/dL (ref 6–23)
CO2: 28 mEq/L (ref 19–32)
Calcium: 9.7 mg/dL (ref 8.4–10.5)
Chloride: 102 mEq/L (ref 96–112)
Creatinine, Ser: 0.79 mg/dL (ref 0.40–1.20)
GFR: 88.86 mL/min (ref >60.00)
Glucose, Bld: 84 mg/dL (ref 70–99)
Potassium: 4.5 mEq/L (ref 3.5–5.1)
Sodium: 138 mEq/L (ref 135–145)
Total Bilirubin: 0.6 mg/dL (ref 0.2–1.2)
Total Protein: 7.3 g/dL (ref 6.0–8.3)

## 2023-02-20 LAB — LIPID PANEL
Cholesterol: 162 mg/dL (ref 0–200)
HDL: 44.1 mg/dL (ref >39.00)
LDL Cholesterol: 97 mg/dL (ref 0–99)
NonHDL: 118.37
Total CHOL/HDL Ratio: 4
Triglycerides: 106 mg/dL (ref 0.0–149.0)
VLDL: 21.2 mg/dL (ref 0.0–40.0)

## 2023-02-20 LAB — MAGNESIUM: Magnesium: 1.8 mg/dL (ref 1.5–2.5)

## 2023-02-20 LAB — HEMOGLOBIN A1C: Hgb A1c MFr Bld: 5.5 % (ref 4.6–6.5)

## 2023-02-20 LAB — TSH: TSH: 0.77 u[IU]/mL (ref 0.35–5.50)

## 2023-02-20 LAB — VITAMIN B12: Vitamin B-12: 280 pg/mL (ref 211–911)

## 2023-02-20 NOTE — Progress Notes (Signed)
[name] is a 47 y.o. female with the following history as recorded in EpicCare:  Patient Active Problem List   Diagnosis Date Noted   Allergic rhinitis 02/14/2019   Migraine headache 02/17/2018   S/P vaginal hysterectomy 03/19/2017   Upper respiratory tract infection 09/29/2016   Costochondritis 09/09/2016   EUSTACHIAN TUBE DYSFUNCTION, RIGHT 07/04/2009   KNEE PAIN 08/23/2007   Asthma 01/11/2007    Current Outpatient Medications  Medication Sig Dispense Refill   clobetasol ointment (TEMOVATE) 0.05 % clobetasol 0.05 % topical ointment  Apply to affected areas bid prn     fexofenadine (ALLEGRA) 180 MG tablet Take 180 mg by mouth daily.     No current facility-administered medications for this visit.    Allergies: Sulfa antibiotics  Past Medical History:  Diagnosis Date   Abnormal uterine bleeding (AUB)    Eczema    GERD (gastroesophageal reflux disease)    History of exercise stress test    09-17-2016--- NORMAL  (NO ISCHEMIA AND NO EVIDENCE HEART ARTERY DISEASE)   Seasonal allergies     Past Surgical History:  Procedure Laterality Date   BILATERAL SALPINGECTOMY Bilateral 03/19/2017   Procedure: BILATERAL SALPINGECTOMY;  Surgeon: Lavina Hamman, MD;  Location: [facility];  Service: Gynecology;  Laterality: Bilateral;   CERVICAL BIOPSY  W/ LOOP ELECTRODE EXCISION  yrs ago   CYSTOSCOPY N/A 03/19/2017   Procedure: CYSTOSCOPY;  Surgeon: Lavina Hamman, MD;  Location: [facility] [place];  Service: Gynecology;  Laterality: N/A;   D & C HYSTEROSCOPY / POLYPECTOMY/  ENDOMETRIAL ABLATION  05/27/2016   DX LAPAROSCOPY  01/26/2003   HIP ARTHROSCOPY W/ LABRAL DEBRIDEMENT Left 06/14/2003   and Chondroplasty   KNEE ARTHROSCOPY W/ ACL RECONSTRUCTION Bilateral left 1992/  right 2016   KNEE ARTHROSCOPY W/ MEDIAL COLLATERAL LIGAMENT (MCL) REPAIR Right 2017   LAPAROSCOPIC TUBAL LIGATION Bilateral 11/11/2005   VAGINAL HYSTERECTOMY N/A 03/19/2017   Procedure:  HYSTERECTOMY VAGINAL;  Surgeon: Lavina Hamman, MD;  Location: [facility];  Service: Gynecology;  Laterality: N/A;  pt will be OIB    Family History  Problem Relation Age of Onset   Diabetes Maternal Grandmother    Breast cancer Maternal Grandmother    Heart disease Maternal Grandfather    Stroke Maternal Grandfather    Cancer Maternal Grandfather    Diabetes Maternal Grandfather    Hypertension Maternal Grandfather    Heart disease Paternal Grandmother    Hypertension Paternal Grandmother    Cancer Mother 27       urethra cancer with metastasis    Social History   Tobacco Use   Smoking status: Former    Years: 15    Types: Cigarettes    Quit date: 2006    Years since quitting: 18.3   Smokeless tobacco: Never  Substance Use Topics   Alcohol use: Yes    Alcohol/week: 4.0 standard drinks of alcohol    Types: 4 Standard drinks or equivalent per week    Comment: occasional    Subjective:   Patient presents for yearly CPE; does see GYN regularly; needs to get scheduled for baseline colonoscopy;    Would also like to discuss concerns for complications from concussions-  per patient, she has had 3 head injuries/ concussions in the past year; 1st episode slipped on wet garage floor; approximately 8 months later, hit head on tail gate; approximately 6 weeks later, hit in the face by a soccer ball; now in the past year, dexterity issues, "feels  like head is twitching"- feels " a little absent minded"/ occasional tremor;   Known knee issues- stability affected as a result; doing viscous gel injections- Dr. Penni Bombard; going back in June for further evaluation;   Review of Systems  Constitutional: Negative.   HENT: Negative.    Eyes: Negative.   Respiratory: Negative.    Cardiovascular: Negative.   Gastrointestinal: Negative.   Genitourinary: Negative.   Musculoskeletal:  Positive for joint pain.  Skin: Negative.   Neurological:  Positive for tremors, sensory  change and headaches.  Endo/Heme/Allergies: Negative.   Psychiatric/Behavioral: Negative.       Objective:  Vitals:   02/20/23 1029  BP: 116/64  Pulse: 75  Resp: 18  SpO2: 98%  Weight: 217 lb 12.8 oz (98.8 kg)  Height: 5\' 5"  (1.651 m)    General: Well developed, well nourished, in no acute distress  Skin : Warm and dry.  Head: Normocephalic and atraumatic  Eyes: Sclera and conjunctiva clear; pupils round and reactive to light; extraocular movements intact  Ears: External normal; canals clear; tympanic membranes normal  Oropharynx: Pink, supple. No suspicious lesions  Neck: Supple without thyromegaly, adenopathy  Lungs: Respirations unlabored; clear to auscultation bilaterally without wheeze, rales, rhonchi  CVS exam: normal rate and regular rhythm.  Abdomen: Soft; nontender; nondistended; normoactive bowel sounds; no masses or hepatosplenomegaly  Musculoskeletal: No deformities; no active joint inflammation  Extremities: No edema, cyanosis, clubbing  Vessels: Symmetric bilaterally  Neurologic: Alert and oriented; speech intact; face symmetrical; moves all extremities well; CNII-XII intact without focal deficit   Assessment:  1. PE (physical exam), annual   2. Encounter for screening colonoscopy   3. Lipid screening   4. Numbness and tingling   5. Multiple injuries of head, sequela     Plan:  Age appropriate preventive healthcare needs addressed; encouraged regular eye doctor and dental exams; encouraged regular exercise; will update labs and refills as needed today; follow-up to be determined;  Will update brain MRI; will also refer to neurology; check B12, magnesium, TSH today as well;   No follow-ups on file.  Orders Placed This Encounter  Procedures   MR Brain Wo Contrast    Standing Status:   Future    Standing Expiration Date:   02/20/2024    Order Specific Question:   What is the patient's sedation requirement?    Answer:   No Sedation    Order Specific  Question:   Does the patient have a pacemaker or implanted devices?    Answer:   No    Order Specific Question:   Preferred imaging location?    Answer:   [facility] (table limit-550lbs)   CBC with Differential/Platelet   Comp Met (CMET)   Lipid panel   TSH   B12   Magnesium   Hemoglobin A1c   Ambulatory referral to Neurology    Referral Priority:   Routine    Referral Type:   Consultation    Referral Reason:   Specialty Services Required    Requested Specialty:   Neurology    Number of Visits Requested:   1   Ambulatory referral to Gastroenterology    Referral Priority:   Routine    Referral Type:   Consultation    Referral Reason:   Specialty Services Required    Number of Visits Requested:   1    Requested Prescriptions    No prescriptions requested or ordered in this encounter

## 2023-03-02 ENCOUNTER — Encounter: Payer: Self-pay | Admitting: Internal Medicine

## 2023-03-22 ENCOUNTER — Ambulatory Visit
Admission: RE | Admit: 2023-03-22 | Discharge: 2023-03-22 | Disposition: A | Discharge status: Home / Self Care | Payer: 59 | Source: Ambulatory Visit | Attending: Family | Admitting: Family

## 2023-03-22 DIAGNOSIS — S0990XS Unspecified injury of head, sequela: Secondary | ICD-10-CM

## 2023-04-02 DIAGNOSIS — N939 Abnormal uterine and vaginal bleeding, unspecified: Secondary | ICD-10-CM | POA: Insufficient documentation

## 2023-04-13 ENCOUNTER — Ambulatory Visit (AMBULATORY_SURGERY_CENTER): Payer: 59

## 2023-04-13 ENCOUNTER — Encounter: Payer: Self-pay | Admitting: Internal Medicine

## 2023-04-13 VITALS — Ht 65.0 in | Wt 215.0 lb

## 2023-04-13 DIAGNOSIS — Z1211 Encounter for screening for malignant neoplasm of colon: Secondary | ICD-10-CM

## 2023-04-13 MED ORDER — NA SULFATE-K SULFATE-MG SULF 17.5-3.13-1.6 GM/177ML PO SOLN
1.0000 | Freq: Once | ORAL | 0 refills | Status: AC
Start: 2023-04-13 — End: 2023-04-13

## 2023-04-13 NOTE — Progress Notes (Signed)
No egg or soy allergy known to patient  No issues known to pt with past sedation with any surgeries or procedures Patient denies ever being told they had issues or difficulty with intubation  No FH of Malignant Hyperthermia Pt is not on diet pills Pt is not on  home 02  Pt is not on blood thinners  Pt denies issues with constipation  No A fib or A flutter Have any cardiac testing pending--no  LOA: independent   Patient's chart reviewed by Cathlyn Parsons CNRA prior to previsit and patient appropriate for the [facility].  Previsit completed and red dot placed by patient's name on their procedure day (on provider's schedule).     PV completed. Prep reviewed with patient. Instructions sent via mychart / home address. Good Rx coupon for [facility] provided. Pt instructed to use Singlecare.com or GoodRx for a price reduction on prep if out of pocket cost exceeds the coupon price.

## 2023-05-04 ENCOUNTER — Ambulatory Visit (AMBULATORY_SURGERY_CENTER): Payer: 59 | Admitting: Internal Medicine

## 2023-05-04 ENCOUNTER — Encounter: Payer: Self-pay | Admitting: Internal Medicine

## 2023-05-04 VITALS — BP 136/78 | HR 64 | Temp 98.4°F | Resp 12 | Ht 65.0 in | Wt 215.0 lb

## 2023-05-04 DIAGNOSIS — Z1211 Encounter for screening for malignant neoplasm of colon: Secondary | ICD-10-CM | POA: Diagnosis present

## 2023-05-04 DIAGNOSIS — D123 Benign neoplasm of transverse colon: Secondary | ICD-10-CM | POA: Diagnosis not present

## 2023-05-04 DIAGNOSIS — D122 Benign neoplasm of ascending colon: Secondary | ICD-10-CM | POA: Diagnosis not present

## 2023-05-04 DIAGNOSIS — K6389 Other specified diseases of intestine: Secondary | ICD-10-CM

## 2023-05-04 DIAGNOSIS — K635 Polyp of colon: Secondary | ICD-10-CM

## 2023-05-04 MED ORDER — SODIUM CHLORIDE 0.9 % IV SOLN
500.0000 mL | Freq: Once | INTRAVENOUS | Status: DC
Start: 2023-05-04 — End: 2023-05-04

## 2023-05-04 NOTE — Progress Notes (Signed)
Pt's states no medical or surgical changes since previsit or office visit. 

## 2023-05-04 NOTE — Op Note (Signed)
[facility] Patient Name: [name] Procedure Date: 05/04/2023 2:31 PM MRN: [id number] Endoscopist: Wilhemina Bonito. Marina Goodell , MD, [id number] Age: 47 Referring MD:  Date of Birth: [birth year] Gender: Female Account #: [id number] Procedure:                Colonoscopy with cold snare polypectomy x 1; biopsy                            polypectomy x 1 Indications:              Screening for colorectal malignant neoplasm Medicines:                Monitored Anesthesia Care Procedure:                Pre-Anesthesia Assessment:                           - Prior to the procedure, a History and Physical                            was performed, and patient medications and                            allergies were reviewed. The patient's tolerance of                            previous anesthesia was also reviewed. The risks                            and benefits of the procedure and the sedation                            options and risks were discussed with the patient.                            All questions were answered, and informed consent                            was obtained. Prior Anticoagulants: The patient has                            taken no anticoagulant or antiplatelet agents. ASA                            Grade Assessment: II - A patient with mild systemic                            disease. After reviewing the risks and benefits,                            the patient was deemed in satisfactory condition to                            undergo the procedure.  After obtaining informed consent, the colonoscope                            was passed under direct vision. Throughout the                            procedure, the patient's blood pressure, pulse, and                            oxygen saturations were monitored continuously. The                            Olympus Scope SN: [id number] was introduced through                            the anus and  advanced to the the cecum, identified                            by appendiceal orifice and ileocecal valve. The                            ileocecal valve, appendiceal orifice, and rectum                            were photographed. The quality of the bowel                            preparation was excellent. The colonoscopy was                            performed without difficulty. The patient tolerated                            the procedure well. The bowel preparation used was                            SUPREP via split dose instruction. Scope In: 2:42:57 PM Scope Out: 2:55:44 PM Scope Withdrawal Time: 0 hours 10 minutes 38 seconds  Total Procedure Duration: 0 hours 12 minutes 47 seconds  Findings:                 A 5 mm polyp was found in the ascending colon. The                            polyp was sessile. The polyp was removed with a                            cold snare. Resection and retrieval were complete.                           A 1 mm polyp was found in the transverse colon. The  polyp was removed with a jumbo cold forceps.                            Resection and retrieval were complete.                           The exam was otherwise without abnormality on                            direct and retroflexion views. Complications:            No immediate complications. Estimated blood loss:                            None. Estimated Blood Loss:     Estimated blood loss: none. Impression:               - One 5 mm polyp in the ascending colon, removed                            with a cold snare. Resected and retrieved.                           - One 1 mm polyp in the transverse colon, removed                            with a jumbo cold forceps. Resected and retrieved.                           - The examination was otherwise normal on direct                            and retroflexion views. Recommendation:           - Repeat colonoscopy in  7-10 years for surveillance.                           - Patient has a contact number available for                            emergencies. The signs and symptoms of potential                            delayed complications were discussed with the                            patient. Return to normal activities tomorrow.                            Written discharge instructions were provided to the                            patient.                           - Resume previous  diet.                           - Continue present medications.                           - Await pathology results. Wilhemina Bonito. Marina Goodell, MD 05/04/2023 3:02:13 PM This report has been signed electronically.

## 2023-05-04 NOTE — Progress Notes (Signed)
Called to room to assist during endoscopic procedure.  Patient ID and intended procedure confirmed with present staff. Received instructions for my participation in the procedure from the performing physician.  

## 2023-05-04 NOTE — Progress Notes (Signed)
HISTORY OF PRESENT ILLNESS:  [name] is a 47 y.o. female presents today for routine screening colonoscopy.  No complaints.  REVIEW OF SYSTEMS:  All non-GI ROS negative except for  Past Medical History:  Diagnosis Date   Abnormal uterine bleeding (AUB)    Eczema    GERD (gastroesophageal reflux disease)    History of exercise stress test    09-17-2016--- NORMAL  (NO ISCHEMIA AND NO EVIDENCE HEART ARTERY DISEASE)   Seasonal allergies     Past Surgical History:  Procedure Laterality Date   BILATERAL SALPINGECTOMY Bilateral 03/19/2017   Procedure: BILATERAL SALPINGECTOMY;  Surgeon: Lavina Hamman, MD;  Location: [facility] [place];  Service: Gynecology;  Laterality: Bilateral;   CERVICAL BIOPSY  W/ LOOP ELECTRODE EXCISION  yrs ago   CYSTOSCOPY N/A 03/19/2017   Procedure: CYSTOSCOPY;  Surgeon: Lavina Hamman, MD;  Location: [facility] [place];  Service: Gynecology;  Laterality: N/A;   D & C HYSTEROSCOPY / POLYPECTOMY/  ENDOMETRIAL ABLATION  05/27/2016   DX LAPAROSCOPY  01/26/2003   HIP ARTHROSCOPY W/ LABRAL DEBRIDEMENT Left 06/14/2003   and Chondroplasty   KNEE ARTHROSCOPY W/ ACL RECONSTRUCTION Bilateral left 1992/  right 2016   KNEE ARTHROSCOPY W/ MEDIAL COLLATERAL LIGAMENT (MCL) REPAIR Right 2017   LAPAROSCOPIC TUBAL LIGATION Bilateral 11/11/2005   VAGINAL HYSTERECTOMY N/A 03/19/2017   Procedure: HYSTERECTOMY VAGINAL;  Surgeon: Lavina Hamman, MD;  Location: [facility];  Service: Gynecology;  Laterality: N/A;  pt will be OIB    Social History [name]  reports that she quit smoking about 18 years ago. Her smoking use included cigarettes. She has never used smokeless tobacco. She reports current alcohol use of about 4.0 standard drinks of alcohol per week. She reports that she does not use drugs.  family history includes Breast cancer in her maternal grandmother; Cancer in her maternal grandfather; Cancer (age of onset: 27) in her mother;  Diabetes in her maternal grandfather and maternal grandmother; Heart disease in her maternal grandfather and paternal grandmother; Hypertension in her maternal grandfather and paternal grandmother; Stroke in her maternal grandfather.  Allergies  Allergen Reactions   Sulfamethoxazole Anaphylaxis   Sulfa Antibiotics Nausea Only       PHYSICAL EXAMINATION: Vital signs: BP 135/85   Pulse 68   Temp 98.4 F (36.9 C)   Resp 14   Ht 5\' 5"  (1.651 m)   Wt 215 lb (97.5 kg)   LMP 03/19/2017   SpO2 100%   BMI 35.78 kg/m  General: Well-developed, well-nourished, no acute distress HEENT: Sclerae are anicteric, conjunctiva pink. Oral mucosa intact Lungs: Clear Heart: Regular Abdomen: soft, nontender, nondistended, no obvious ascites, no peritoneal signs, normal bowel sounds. No organomegaly. Extremities: No edema Psychiatric: alert and oriented x3. Cooperative     ASSESSMENT:  Colon cancer screening   PLAN:   Screening colonoscopy

## 2023-05-04 NOTE — Patient Instructions (Signed)
Handout on polyps given to patient Await pathology results Resume previous diet and continue present medications Repeat colonoscopy in 7-10 years for surveillance based off of pathology results   YOU HAD AN ENDOSCOPIC PROCEDURE TODAY AT THE [facility]:   Refer to the procedure report that was given to you for any specific questions about what was found during the examination.  If the procedure report does not answer your questions, please call your gastroenterologist to clarify.  If you requested that your care partner not be given the details of your procedure findings, then the procedure report has been included in a sealed envelope for you to review at your convenience later.  YOU SHOULD EXPECT: Some feelings of bloating in the abdomen. Passage of more gas than usual.  Walking can help get rid of the air that was put into your GI tract during the procedure and reduce the bloating. If you had a lower endoscopy (such as a colonoscopy or flexible sigmoidoscopy) you may notice spotting of blood in your stool or on the toilet paper. If you underwent a bowel prep for your procedure, you may not have a normal bowel movement for a few days.  Please Note:  You might notice some irritation and congestion in your nose or some drainage.  This is from the oxygen used during your procedure.  There is no need for concern and it should clear up in a day or so.  SYMPTOMS TO REPORT IMMEDIATELY:  Following lower endoscopy (colonoscopy or flexible sigmoidoscopy):  Excessive amounts of blood in the stool  Significant tenderness or worsening of abdominal pains  Swelling of the abdomen that is new, acute  Fever of 100F or higher   For urgent or emergent issues, a gastroenterologist can be reached at any hour by calling [phone]. Do not use MyChart messaging for urgent concerns.    DIET:  We do recommend a small meal at first, but then you may proceed to your regular diet.  Drink plenty of  fluids but you should avoid alcoholic beverages for 24 hours.  ACTIVITY:  You should plan to take it easy for the rest of today and you should NOT DRIVE or use heavy machinery until tomorrow (because of the sedation medicines used during the test).    FOLLOW UP: Our staff will call the number listed on your records the next business day following your procedure.  We will call around 7:15- 8:00 am to check on you and address any questions or concerns that you may have regarding the information given to you following your procedure. If we do not reach you, we will leave a message.     If any biopsies were taken you will be contacted by phone or by letter within the next 1-3 weeks.  Please call us at [phone] if you have not heard about the biopsies in 3 weeks.    SIGNATURES/CONFIDENTIALITY: You and/or your care partner have signed paperwork which will be entered into your electronic medical record.  These signatures attest to the fact that that the information above on your After Visit Summary has been reviewed and is understood.  Full responsibility of the confidentiality of this discharge information lies with you and/or your care-partner.

## 2023-05-04 NOTE — Progress Notes (Signed)
Sedate, gd SR, tolerated procedure well, VSS, report to RN 

## 2023-05-05 ENCOUNTER — Telehealth: Payer: Self-pay | Admitting: *Deleted

## 2023-05-05 NOTE — Telephone Encounter (Signed)
  Follow up Call-     05/04/2023    1:31 PM  Call back number  Post procedure Call Back phone  # [phone]  Permission to leave phone message Yes     Patient questions:  Do you have a fever, pain , or abdominal swelling? No. Pain Score  0 *  Have you tolerated food without any problems? Yes.    Have you been able to return to your normal activities? Yes.    Do you have any questions about your discharge instructions: Diet   No. Medications  No. Follow up visit  No.  Do you have questions or concerns about your Care? No.  Actions: * If pain score is 4 or above: No action needed, pain <4.

## 2023-05-07 ENCOUNTER — Encounter: Payer: Self-pay | Admitting: Internal Medicine

## 2023-06-04 ENCOUNTER — Ambulatory Visit: Payer: Self-pay | Admitting: Neurology

## 2024-06-17 ENCOUNTER — Encounter: Payer: Self-pay | Admitting: Family

## 2024-06-17 ENCOUNTER — Ambulatory Visit (INDEPENDENT_AMBULATORY_CARE_PROVIDER_SITE_OTHER): Admitting: Family

## 2024-06-17 VITALS — BP 126/78 | HR 76 | Temp 98.1°F | Ht 65.0 in | Wt 219.6 lb

## 2024-06-17 DIAGNOSIS — Z13220 Encounter for screening for lipoid disorders: Secondary | ICD-10-CM

## 2024-06-17 DIAGNOSIS — Z0000 Encounter for general adult medical examination without abnormal findings: Secondary | ICD-10-CM

## 2024-06-17 NOTE — Progress Notes (Signed)
 [name] is a 48 y.o. female with the following history as recorded in EpicCare:  Patient Active Problem List   Diagnosis Date Noted   Abnormal uterine bleeding unrelated to menstrual cycle 04/02/2023   Pain in right foot 01/23/2023   Peroneal tendinitis 01/06/2023   Osteoarthritis of knees, bilateral 08/26/2022   Knee pain, bilateral 08/05/2022   Neck pain 05/23/2020   Pain in joint of left shoulder 01/10/2020   Acute ear pain, bilateral 09/06/2019   Arthralgia of right temporomandibular joint 09/06/2019   Allergic rhinitis 02/14/2019   Pain in left knee 11/16/2018   Pain in right knee 11/16/2018   Pain in joint of right shoulder 08/19/2018   Lichen sclerosus of female genitalia 05/04/2018   Migraine headache 02/17/2018   S/P vaginal hysterectomy 03/19/2017   Upper respiratory tract infection 09/29/2016   Costochondritis 09/09/2016   Submucous leiomyoma of uterus 05/27/2016   EUSTACHIAN TUBE DYSFUNCTION, RIGHT 07/04/2009   KNEE PAIN 08/23/2007   Asthma 01/11/2007    Current Outpatient Medications  Medication Sig Dispense Refill   clobetasol ointment (TEMOVATE) 0.05 % Apply 1 Application topically 2 (two) times daily as needed.     estradiol (VIVELLE-DOT) 0.1 MG/24HR patch 1 patch.     fexofenadine (ALLEGRA) 180 MG tablet Take 180 mg by mouth daily.     meloxicam (MOBIC) 7.5 MG tablet Take 7.5 mg by mouth daily as needed.     No current facility-administered medications for this visit.    Allergies: Sulfamethoxazole and Sulfa antibiotics  Past Medical History:  Diagnosis Date   Abnormal uterine bleeding (AUB)    Eczema    GERD (gastroesophageal reflux disease)    History of exercise stress test    09-17-2016--- NORMAL  (NO ISCHEMIA AND NO EVIDENCE HEART ARTERY DISEASE)   Seasonal allergies     Past Surgical History:  Procedure Laterality Date   BILATERAL SALPINGECTOMY Bilateral 03/19/2017   Procedure: BILATERAL SALPINGECTOMY;  Surgeon: Horacio Boas, MD;   Location: [facility] [place];  Service: Gynecology;  Laterality: Bilateral;   CERVICAL BIOPSY  W/ LOOP ELECTRODE EXCISION  yrs ago   CYSTOSCOPY N/A 03/19/2017   Procedure: CYSTOSCOPY;  Surgeon: Horacio Boas, MD;  Location[facility];  Service: Gynecology;  Laterality: N/A;   D & C HYSTEROSCOPY / POLYPECTOMY/  ENDOMETRIAL ABLATION  05/27/2016   DX LAPAROSCOPY  01/26/2003   HIP ARTHROSCOPY W/ LABRAL DEBRIDEMENT Left 06/14/2003   and Chondroplasty   KNEE ARTHROSCOPY W/ ACL RECONSTRUCTION Bilateral left 1992/  right 2016   KNEE ARTHROSCOPY W/ MEDIAL COLLATERAL LIGAMENT (MCL) REPAIR Right 2017   LAPAROSCOPIC TUBAL LIGATION Bilateral 11/11/2005   VAGINAL HYSTERECTOMY N/A 03/19/2017   Procedure: HYSTERECTOMY VAGINAL;  Surgeon: Horacio Boas, MD;  Location: [facility];  Service: Gynecology;  Laterality: N/A;  pt will be OIB    Family History  Problem Relation Age of Onset   Cancer Mother 77       urethra cancer with metastasis   Diabetes Maternal Grandmother    Breast cancer Maternal Grandmother    Heart disease Maternal Grandfather    Stroke Maternal Grandfather    Cancer Maternal Grandfather    Diabetes Maternal Grandfather    Hypertension Maternal Grandfather    Heart disease Paternal Grandmother    Hypertension Paternal Grandmother    Colon cancer Neg Hx    Rectal cancer Neg Hx    Stomach cancer Neg Hx    Esophageal cancer Neg Hx  Social History   Tobacco Use   Smoking status: Former    Current packs/day: 0.00    Types: Cigarettes    Start date: 39    Quit date: 2006    Years since quitting: 19.6   Smokeless tobacco: Never  Substance Use Topics   Alcohol use: Yes    Alcohol/week: 4.0 standard drinks of alcohol    Types: 4 Standard drinks or equivalent per week    Comment: occasional    Subjective:   Patient presents for yearly CPE; does see GYN regularly; also working with orthopedist for chronic left elbow pain- will be  having nerve conduction study;   Review of Systems  Constitutional: Negative.   HENT: Negative.    Eyes: Negative.   Respiratory: Negative.    Cardiovascular: Negative.   Gastrointestinal: Negative.   Genitourinary: Negative.   Musculoskeletal: Negative.   Skin: Negative.   Neurological: Negative.   Endo/Heme/Allergies: Negative.   Psychiatric/Behavioral: Negative.       Objective:  Vitals:   06/17/24 1537  BP: 126/78  Pulse: 76  Temp: 98.1 F (36.7 C)  TempSrc: Oral  SpO2: 96%  Weight: 219 lb 9.6 oz (99.6 kg)  Height: 5' 5 (1.651 m)    General: Well developed, well nourished, in no acute distress  Skin : Warm and dry.  Head: Normocephalic and atraumatic  Eyes: Sclera and conjunctiva clear; pupils round and reactive to light; extraocular movements intact  Ears: External normal; canals clear; tympanic membranes normal  Oropharynx: Pink, supple. No suspicious lesions  Neck: Supple without thyromegaly, adenopathy  Lungs: Respirations unlabored; clear to auscultation bilaterally without wheeze, rales, rhonchi  CVS exam: normal rate and regular rhythm.  Abdomen: Soft; nontender; nondistended; normoactive bowel sounds; no masses or hepatosplenomegaly  Musculoskeletal: No deformities; no active joint inflammation  Extremities: No edema, cyanosis, clubbing  Vessels: Symmetric bilaterally  Neurologic: Alert and oriented; speech intact; face symmetrical; moves all extremities well; CNII-XII intact without focal deficit   Assessment:  1. PE (physical exam), annual   2. Lipid screening     Plan:  Age appropriate preventive healthcare needs addressed; encouraged regular eye doctor and dental exams; encouraged regular exercise; will update labs and refills as needed today; follow-up in 1 year;   She is aware that she will need to establish with a new PCP as I will be leaving this office; she will look into establishing with new provider in [place].    No follow-ups on  file.  Orders Placed This Encounter  Procedures   CBC with Differential/Platelet   Comp Met (CMET)   Lipid panel   Hemoglobin A1c    Requested Prescriptions    No prescriptions requested or ordered in this encounter

## 2024-06-17 NOTE — Patient Instructions (Signed)
 Nice to see you today; I wish you all the best.  I would recommend that you consider seeing Rollene Northern at [facility] in [place]; [phone]; you would need to schedule a transfer of care appointment with her.

## 2024-06-18 LAB — LIPID PANEL
Cholesterol: 171 mg/dL (ref <200)
HDL: 45 mg/dL — ABNORMAL LOW (ref >=50)
LDL Cholesterol (Calc): 104 mg/dL — ABNORMAL HIGH
Non-HDL Cholesterol (Calc): 126 mg/dL (ref <130)
Total CHOL/HDL Ratio: 3.8 (calc) (ref <5.0)
Triglycerides: 128 mg/dL (ref <150)

## 2024-06-18 LAB — CBC WITH DIFFERENTIAL/PLATELET
Absolute Lymphocytes: 2719 {cells}/uL (ref 850–3900)
Absolute Monocytes: 752 {cells}/uL (ref 200–950)
Basophils Absolute: 41 {cells}/uL (ref 0–200)
Basophils Relative: 0.4 %
Eosinophils Absolute: 144 {cells}/uL (ref 15–500)
Eosinophils Relative: 1.4 %
HCT: 42.6 % (ref 35.0–45.0)
Hemoglobin: 14.3 g/dL (ref 11.7–15.5)
MCH: 32.6 pg (ref 27.0–33.0)
MCHC: 33.6 g/dL (ref 32.0–36.0)
MCV: 97.3 fL (ref 80.0–100.0)
MPV: 12 fL (ref 7.5–12.5)
Monocytes Relative: 7.3 %
Neutro Abs: 6644 {cells}/uL (ref 1500–7800)
Neutrophils Relative %: 64.5 %
Platelets: 272 Thousand/uL (ref 140–400)
RBC: 4.38 Million/uL (ref 3.80–5.10)
RDW: 11.7 % (ref 11.0–15.0)
Total Lymphocyte: 26.4 %
WBC: 10.3 Thousand/uL (ref 3.8–10.8)

## 2024-06-18 LAB — HEMOGLOBIN A1C
Hgb A1c MFr Bld: 5.5 % (ref <5.7)
Mean Plasma Glucose: 111 mg/dL
eAG (mmol/L): 6.2 mmol/L

## 2024-06-18 LAB — COMPREHENSIVE METABOLIC PANEL WITH GFR
AG Ratio: 1.5 (calc) (ref 1.0–2.5)
ALT: 11 U/L (ref 6–29)
AST: 8 U/L — ABNORMAL LOW (ref 10–35)
Albumin: 4.2 g/dL (ref 3.6–5.1)
Alkaline phosphatase (APISO): 74 U/L (ref 31–125)
BUN: 14 mg/dL (ref 7–25)
CO2: 29 mmol/L (ref 20–32)
Calcium: 9.6 mg/dL (ref 8.6–10.2)
Chloride: 102 mmol/L (ref 98–110)
Creat: 0.76 mg/dL (ref 0.50–0.99)
Globulin: 2.8 g/dL (ref 1.9–3.7)
Glucose, Bld: 80 mg/dL (ref 65–99)
Potassium: 4.1 mmol/L (ref 3.5–5.3)
Sodium: 136 mmol/L (ref 135–146)
Total Bilirubin: 0.4 mg/dL (ref 0.2–1.2)
Total Protein: 7 g/dL (ref 6.1–8.1)
eGFR: 96 mL/min/1.73m2 (ref >=60)

## 2024-06-20 ENCOUNTER — Ambulatory Visit: Payer: Self-pay | Admitting: Family

## 2024-07-25 ENCOUNTER — Telehealth: Admitting: Physician Assistant

## 2024-07-25 DIAGNOSIS — J4521 Mild intermittent asthma with (acute) exacerbation: Secondary | ICD-10-CM | POA: Diagnosis not present

## 2024-07-25 MED ORDER — ALBUTEROL SULFATE HFA 108 (90 BASE) MCG/ACT IN AERS: 1.0000 | INHALATION_SPRAY | Freq: Four times a day (QID) | RESPIRATORY_TRACT | 0 refills | Status: AC | PRN

## 2024-07-25 NOTE — Progress Notes (Signed)
